# Patient Record
Sex: Female | Born: 1965
Health system: Southern US, Community
[De-identification: ages and names within clinical notes are randomized; demographics above are authoritative.]

## PROBLEM LIST (undated history)

## (undated) DIAGNOSIS — E119 Type 2 diabetes mellitus without complications: Secondary | ICD-10-CM

## (undated) DIAGNOSIS — H269 Unspecified cataract: Secondary | ICD-10-CM

## (undated) DIAGNOSIS — F32A Depression, unspecified: Secondary | ICD-10-CM

## (undated) DIAGNOSIS — R079 Chest pain, unspecified: Secondary | ICD-10-CM

## (undated) DIAGNOSIS — M109 Gout, unspecified: Secondary | ICD-10-CM

## (undated) DIAGNOSIS — M1990 Unspecified osteoarthritis, unspecified site: Secondary | ICD-10-CM

## (undated) DIAGNOSIS — R0602 Shortness of breath: Secondary | ICD-10-CM

## (undated) DIAGNOSIS — T7840XA Allergy, unspecified, initial encounter: Secondary | ICD-10-CM

## (undated) DIAGNOSIS — R42 Dizziness and giddiness: Secondary | ICD-10-CM

## (undated) DIAGNOSIS — F419 Anxiety disorder, unspecified: Secondary | ICD-10-CM

## (undated) DIAGNOSIS — G5600 Carpal tunnel syndrome, unspecified upper limb: Secondary | ICD-10-CM

## (undated) HISTORY — DX: Shortness of breath: R06.02

## (undated) HISTORY — DX: Unspecified cataract: H26.9

## (undated) HISTORY — DX: Gout, unspecified: M10.9

## (undated) HISTORY — DX: Type 2 diabetes mellitus without complications: E11.9

## (undated) HISTORY — DX: Carpal tunnel syndrome, unspecified upper limb: G56.00

## (undated) HISTORY — DX: Depression, unspecified: F32.A

## (undated) HISTORY — PX: EYE SURGERY: SHX253

## (undated) HISTORY — DX: Chest pain, unspecified: R07.9

## (undated) HISTORY — DX: Unspecified osteoarthritis, unspecified site: M19.90

## (undated) HISTORY — PX: TONSILECTOMY, ADENOIDECTOMY, BILATERAL MYRINGOTOMY AND TUBES: SHX2538

## (undated) HISTORY — DX: Allergy, unspecified, initial encounter: T78.40XA

## (undated) HISTORY — DX: Dizziness and giddiness: R42

## (undated) HISTORY — DX: Anxiety disorder, unspecified: F41.9

## (undated) HISTORY — PX: CATARACT EXTRACTION: SUR2

## (undated) HISTORY — PX: OTHER SURGICAL HISTORY: SHX169

---

## 2000-04-15 ENCOUNTER — Emergency Department (HOSPITAL_COMMUNITY): Admission: EM | Admit: 2000-04-15 | Discharge: 2000-04-16 | Payer: Self-pay | Admitting: *Deleted

## 2001-11-27 ENCOUNTER — Emergency Department (HOSPITAL_COMMUNITY): Admission: EM | Admit: 2001-11-27 | Discharge: 2001-11-28 | Payer: Self-pay

## 2002-07-27 ENCOUNTER — Other Ambulatory Visit: Admission: RE | Admit: 2002-07-27 | Discharge: 2002-07-27 | Payer: Self-pay | Admitting: Obstetrics and Gynecology

## 2002-10-25 ENCOUNTER — Encounter: Payer: Self-pay | Admitting: Obstetrics and Gynecology

## 2002-10-25 ENCOUNTER — Ambulatory Visit (HOSPITAL_COMMUNITY): Admission: RE | Admit: 2002-10-25 | Discharge: 2002-10-25 | Payer: Self-pay | Admitting: Obstetrics and Gynecology

## 2003-08-24 ENCOUNTER — Other Ambulatory Visit: Admission: RE | Admit: 2003-08-24 | Discharge: 2003-08-24 | Payer: Self-pay | Admitting: *Deleted

## 2008-07-11 ENCOUNTER — Encounter: Payer: Self-pay | Admitting: Obstetrics and Gynecology

## 2008-07-11 ENCOUNTER — Ambulatory Visit: Payer: Self-pay | Admitting: Obstetrics and Gynecology

## 2008-07-11 ENCOUNTER — Other Ambulatory Visit: Admission: RE | Admit: 2008-07-11 | Discharge: 2008-07-11 | Payer: Self-pay | Admitting: Obstetrics and Gynecology

## 2008-07-28 ENCOUNTER — Ambulatory Visit: Payer: Self-pay | Admitting: Obstetrics and Gynecology

## 2008-08-03 ENCOUNTER — Ambulatory Visit: Payer: Self-pay | Admitting: Obstetrics and Gynecology

## 2008-08-14 ENCOUNTER — Ambulatory Visit: Payer: Self-pay | Admitting: Obstetrics and Gynecology

## 2008-08-28 ENCOUNTER — Ambulatory Visit (HOSPITAL_COMMUNITY): Admission: RE | Admit: 2008-08-28 | Discharge: 2008-08-28 | Payer: Self-pay | Admitting: Obstetrics and Gynecology

## 2008-09-05 ENCOUNTER — Encounter: Admission: RE | Admit: 2008-09-05 | Discharge: 2008-09-05 | Payer: Self-pay | Admitting: Obstetrics and Gynecology

## 2008-09-18 ENCOUNTER — Encounter: Admission: RE | Admit: 2008-09-18 | Discharge: 2008-11-14 | Payer: Self-pay | Admitting: Endocrinology

## 2008-10-09 ENCOUNTER — Emergency Department (HOSPITAL_COMMUNITY): Admission: EM | Admit: 2008-10-09 | Discharge: 2008-10-10 | Payer: Self-pay | Admitting: Emergency Medicine

## 2008-11-29 ENCOUNTER — Encounter: Admission: RE | Admit: 2008-11-29 | Discharge: 2008-11-29 | Payer: Self-pay | Admitting: Family Medicine

## 2009-10-01 ENCOUNTER — Encounter: Payer: Self-pay | Admitting: Internal Medicine

## 2009-10-01 ENCOUNTER — Ambulatory Visit (HOSPITAL_COMMUNITY): Admission: RE | Admit: 2009-10-01 | Discharge: 2009-10-01 | Payer: Self-pay | Admitting: Family Medicine

## 2010-07-10 ENCOUNTER — Encounter (INDEPENDENT_AMBULATORY_CARE_PROVIDER_SITE_OTHER): Payer: Self-pay | Admitting: *Deleted

## 2010-07-10 ENCOUNTER — Ambulatory Visit
Admission: RE | Admit: 2010-07-10 | Discharge: 2010-07-10 | Payer: Self-pay | Source: Home / Self Care | Attending: Internal Medicine | Admitting: Internal Medicine

## 2010-07-10 ENCOUNTER — Other Ambulatory Visit: Payer: Self-pay | Admitting: Internal Medicine

## 2010-07-10 DIAGNOSIS — N951 Menopausal and female climacteric states: Secondary | ICD-10-CM | POA: Insufficient documentation

## 2010-07-10 DIAGNOSIS — R519 Headache, unspecified: Secondary | ICD-10-CM | POA: Insufficient documentation

## 2010-07-10 DIAGNOSIS — F411 Generalized anxiety disorder: Secondary | ICD-10-CM | POA: Insufficient documentation

## 2010-07-10 DIAGNOSIS — H9190 Unspecified hearing loss, unspecified ear: Secondary | ICD-10-CM | POA: Insufficient documentation

## 2010-07-10 DIAGNOSIS — J309 Allergic rhinitis, unspecified: Secondary | ICD-10-CM | POA: Insufficient documentation

## 2010-07-10 DIAGNOSIS — R51 Headache: Secondary | ICD-10-CM

## 2010-07-10 DIAGNOSIS — Z87442 Personal history of urinary calculi: Secondary | ICD-10-CM | POA: Insufficient documentation

## 2010-07-10 DIAGNOSIS — F339 Major depressive disorder, recurrent, unspecified: Secondary | ICD-10-CM | POA: Insufficient documentation

## 2010-07-10 DIAGNOSIS — F329 Major depressive disorder, single episode, unspecified: Secondary | ICD-10-CM | POA: Insufficient documentation

## 2010-07-10 LAB — BASIC METABOLIC PANEL
BUN: 11 mg/dL (ref 6–23)
CO2: 31 mEq/L (ref 19–32)
Calcium: 10.7 mg/dL — ABNORMAL HIGH (ref 8.4–10.5)
Chloride: 110 mEq/L (ref 96–112)
Creatinine, Ser: 0.5 mg/dL (ref 0.4–1.2)
GFR: 131.5 mL/min (ref >60.00)
Glucose, Bld: 99 mg/dL (ref 70–99)
Potassium: 4.5 mEq/L (ref 3.5–5.1)
Sodium: 146 mEq/L — ABNORMAL HIGH (ref 135–145)

## 2010-07-10 LAB — CBC WITH DIFFERENTIAL/PLATELET
Basophils Absolute: 0 10*3/uL (ref 0.0–0.1)
Basophils Relative: 0.9 % (ref 0.0–3.0)
Eosinophils Absolute: 0.1 10*3/uL (ref 0.0–0.7)
Eosinophils Relative: 1.3 % (ref 0.0–5.0)
HCT: 36.8 % (ref 36.0–46.0)
Hemoglobin: 12.7 g/dL (ref 12.0–15.0)
Lymphocytes Relative: 48.2 % — ABNORMAL HIGH (ref 12.0–46.0)
Lymphs Abs: 2.3 10*3/uL (ref 0.7–4.0)
MCHC: 34.4 g/dL (ref 30.0–36.0)
MCV: 92.6 fl (ref 78.0–100.0)
Monocytes Absolute: 0.3 10*3/uL (ref 0.1–1.0)
Monocytes Relative: 7.1 % (ref 3.0–12.0)
Neutro Abs: 2.1 10*3/uL (ref 1.4–7.7)
Neutrophils Relative %: 42.5 % — ABNORMAL LOW (ref 43.0–77.0)
Platelets: 268 10*3/uL (ref 150.0–400.0)
RBC: 3.98 Mil/uL (ref 3.87–5.11)
RDW: 12.6 % (ref 11.5–14.6)
WBC: 4.8 10*3/uL (ref 4.5–10.5)

## 2010-07-10 LAB — HEPATIC FUNCTION PANEL
ALT: 16 U/L (ref 0–35)
AST: 18 U/L (ref 0–37)
Albumin: 4.4 g/dL (ref 3.5–5.2)
Alkaline Phosphatase: 37 U/L — ABNORMAL LOW (ref 39–117)
Bilirubin, Direct: 0.1 mg/dL (ref 0.0–0.3)
Total Bilirubin: 0.7 mg/dL (ref 0.3–1.2)
Total Protein: 7.4 g/dL (ref 6.0–8.3)

## 2010-07-10 LAB — HEMOGLOBIN A1C: Hgb A1c MFr Bld: 6 % (ref 4.6–6.5)

## 2010-07-10 LAB — TSH: TSH: 0.98 u[IU]/mL (ref 0.35–5.50)

## 2010-07-26 ENCOUNTER — Ambulatory Visit
Admission: RE | Admit: 2010-07-26 | Discharge: 2010-07-26 | Payer: Self-pay | Source: Home / Self Care | Attending: Psychology | Admitting: Psychology

## 2010-07-30 ENCOUNTER — Encounter: Payer: Self-pay | Admitting: Internal Medicine

## 2010-08-05 ENCOUNTER — Ambulatory Visit: Admit: 2010-08-05 | Payer: Self-pay | Admitting: Psychology

## 2010-08-05 ENCOUNTER — Encounter: Payer: Self-pay | Admitting: Internal Medicine

## 2010-08-05 LAB — CONVERTED CEMR LAB
AST: 16 units/L
Alkaline Phosphatase: 35 units/L
Calcium: 9.9 mg/dL
Chloride: 111 meq/L
HDL: 74 mg/dL
Hemoglobin: 12 g/dL
LDL Cholesterol: 77 mg/dL
Potassium: 4.6 meq/L
Sodium: 148 meq/L
Triglyceride fasting, serum: 60 mg/dL
WBC: 3.9 10*3/uL

## 2010-08-08 NOTE — Letter (Signed)
Summary: [facility]  [address] [place], [place] [address]   Phone: [phone]  Fax: [phone]    Today's Date: July 10, 2010  Name of Patient: Joy Bautista  The above named patient had a medical visit today 07/10/10 Please take this into consideration when reviewing the time away from work  Special Instructions:  [  ] None     Sincerely yours,   Dr. Rene Paci

## 2010-08-08 NOTE — Letter (Signed)
Summary: [facility]  [facility]   Imported By: Lester [place] 07/22/2010 07:46:01  _____________________________________________________________________  External Attachment:    Type:   Image     Comment:   External Document

## 2010-08-08 NOTE — Assessment & Plan Note (Signed)
Summary: NEW MED COST PT--#--PKG-STC   Vital Signs:  Patient profile:   45 year old female Height:      65 inches (165.10 cm) Weight:      117.8 pounds (53.55 kg) BMI:     19.67 O2 Sat:      99 % on Room air Temp:     98.1 degrees F (36.72 degrees C) oral Pulse rate:   64 / minute BP sitting:   120 / 70  (left arm) Cuff size:   regular  Vitals Entered By: Orlan Leavens RMA (July 10, 2010 8:08 AM)  O2 Flow:  Room air CC: New patient Is Patient Diabetic? No Pain Assessment Patient in pain? no        Primary Care Provider:  Newt Lukes MD  CC:  New patient.  History of Present Illness: new pt to me and our practice, here to est care has followed at family and urg care prev  c/o "anxiety" onset >12mo - "i have always been hyperactive person" but symptoms gradually worse in past 6 mo symptoms worse with stress - family, work denies palpitations, cp/sob or "dread" - but "i am a constant worrier" symptoms better at home denies problems sleeping - no depression symptoms or "sadness" - no SI/HI started on citalopram summer 2011 - min if any improvement in symptoms -  no change in dose since starting also 6 mo ago rx'd alprazolam - causes too much sedation to use during day but uses at bedtime  prev tx with wellbutrin made "anxiety" much worse describes as distractability, impatience, rapid speech and writing, "in a hurry" but very organized  also reviewed chronic med issues - DM2 - diet controlled - never rx'd meds for same - does not regularly follows cbgs and "its normal when i do check" -   2) menopausal syndrome - temp instability, sweating, worse in cool weather - on hrt by urg care but would like gyn eval for same - no bleeding or spotting  3) chronic neck pain - prior MRI at [facility] imaging 2011 "arthiritis and bulge" (report not available at time of OV) - no radiation of apin - symptoms relieved with otc ibuprofen or tylenol - no balance probs, no injury/trauma  and no falls  Preventive Screening-Counseling & Management  Alcohol-Tobacco     Alcohol drinks/day: 0     Alcohol Counseling: not indicated; patient does not drink     Smoking Status: quit     Tobacco Counseling: not to resume use of tobacco products  Caffeine-Diet-Exercise     Caffeine Counseling: not indicated; caffeine use is not excessive or problematic     Does Patient Exercise: yes     Type of exercise: walking     Exercise (avg: min/session): 30-60     Times/week: 7     Exercise Counseling: not indicated; exercise is adequate     Depression Counseling: further diagnostic testing and/or other treatment is indicated  Comments: quit smoking and drinking 1992: reports self as "probable alcoholic before i woke up and realized i was killing myself so i quit"  Safety-Violence-Falls     Seat Belt Counseling: not indicated; patient wears seat belts     Helmet Counseling: not indicated; patient wears helmet when riding bicycle/motocycle     Firearm Counseling: not applicable     Violence Counseling: not applicable     Fall Risk Counseling: not indicated; no significant falls noted  Current Medications (verified): 1)  Ibuprofen 200 Mg  Tabs (Ibuprofen) .... Take Up To 12 A Day As Needed 2)  Acetaminophen 500 Mg Tabs (Acetaminophen) .... Take Up To 10 A Day As Needed 3)  Alprazolam 0.25 Mg Tabs (Alprazolam) .... Take 1 As Needed 4)  Citalopram Hydrobromide 10 Mg Tabs (Citalopram Hydrobromide) .... Take 1 By Mouth Once Daily 5)  Medroxyprogesterone Acetate 5 Mg Tabs (Medroxyprogesterone Acetate) .... Take 1 By Mouth Once Daily 6)  Calcium-Vitamin D 500-200 Mg-Unit Tabs (Calcium Carbonate-Vitamin D) .... Take 1 By Mouth Once Daily 7)  Melatonin 5 Mg Tabs (Melatonin) .... Take 1 At Bedtime As Needed  Allergies (verified): 1)  ! Sulfa 2)  ! * Shellfish  Past History:  Past Medical History: Allergic rhinitis Depression/anxiety (vs adhd) Diabetes mellitus, type II, diet  controlled hx alcohol abuse - quit 1990s neck pain - DDD menopause syndrome age 37y  MD roster: gyn - optho - [facility]  Past Surgical History: Tonsillectomy (1972)  Family History: Family History of Alcoholism/Addiction (parent) Family History of Arthritis (parent, grandparent, other relative) Family History Breast cancer 1st degree relative <50 (grandmother) Family History Diabetes 1st degree (mother, brother, m grandfather) Family History High cholesterol (parents, grandparent) Family History Hypertension (parent)  dad A&W with dyslipidemia -  mom A&W with chol and DM  Social History: Former Smoker- quit 1990s former EtOH abuse - quit and sober since 1990s divorced x 2 (last in 1994) sinlge, lives alone -  works as Health visitor high school educated 2 pregnancy - one abortion and one given for adoption Smoking Status:  quit Does Patient Exercise:  yes  Review of Systems       c/o occ ringing in left ear esp when using phone (on left side); see HPI above. I have reviewed all other systems and they were negative.   Physical Exam  General:  thin, alert, well-developed, well-nourished, and cooperative to examination.    Head:  Normocephalic and atraumatic without obvious abnormalities. No apparent alopecia or balding. Eyes:  vision grossly intact; pupils equal, round and reactive to light.  conjunctiva and lids normal.    Ears:  normal pinnae bilaterally, without erythema, swelling, or tenderness to palpation. TMs clear, without effusion, or cerumen impaction. Hearing grossly normal bilaterally  Mouth:  teeth and gums in good repair; mucous membranes moist, without lesions or ulcers. oropharynx clear without exudate, no erythema.  Neck:  supple, full ROM, no masses, no thyromegaly; no thyroid nodules or tenderness. no JVD or carotid bruits.   Lungs:  normal respiratory effort, no intercostal retractions or use of accessory muscles; normal breath  sounds bilaterally - no crackles and no wheezes.    Heart:  normal rate, regular rhythm, no murmur, and no rub. BLE without edema. Abdomen:  soft, non-tender, normal bowel sounds, no distention; no masses and no appreciable hepatomegaly or splenomegaly.   Genitalia:  defer gyn Msk:  No deformity or scoliosis noted of thoracic or lumbar spine.   Neurologic:  alert & oriented X3 and cranial nerves II-XII symetrically intact.  strength normal in all extremities, sensation intact to light touch, and gait normal. speech fluent without dysarthria or aphasia; follows commands with good comprehension.  Skin:  no rashes, vesicles, ulcers, or erythema. No nodules or irregularity to palpation.  Psych:  pressure speech, Oriented X3, memory intact for recent and remote, normally interactive, good eye contact, not depressed appearing, not agitated, moderately anxious, and easily distracted.     Impression & Recommendations:  Problem # 1:  ANXIETY STATE,  UNSPECIFIED (ICD-300.00) unclear if hx sugests anxiety or adhd or both - prior tx with wellbutrin made symptoms worse and no sig improvement on low dose citalopram thus far inc dose and refer to behav health for adhd battery testing - ok to cont alpraz if needed (<30 use in past 6 mo) consider counseling esp as pt expresses guilt over "prior failed marriages, the abortion and the child given up for adopton" close f/u here 2-4 weeks to review symptoms, meds and test results also labs today r/o other abn  Her updated medication list for this problem includes:    Alprazolam 0.25 Mg Tabs (Alprazolam) .Marland Kitchen... Take 1 by mouth at bedtime as needed and every 8 hours as needed for anxiety symptoms    Citalopram Hydrobromide 20 Mg Tabs (Citalopram hydrobromide) .Marland Kitchen... 1 by mouth once daily  Orders: TLB-CBC Platelet - w/Differential (85025-CBCD) TLB-Hepatic/Liver Function Pnl (80076-HEPATIC) TLB-TSH (Thyroid Stimulating Hormone) (84443-TSH) TLB-BMP (Basic Metabolic  Panel-BMET) (80048-METABOL) Misc. Referral (Misc. Ref) Prescription Created Electronically ([id number])  Problem # 2:  MENOPAUSAL SYNDROME (ICD-627.2)  Orders: Gynecologic Referral (Gyn)  Problem # 3:  HEARING LOSS, LEFT EAR (ICD-389.9)  Orders: Audiology (Audio)  Problem # 4:  DIABETES MELLITUS, TYPE II (ICD-250.00) diet controlled by report, +FH same send for prior records to review - labs today - Orders: TLB-A1C / Hgb A1C (Glycohemoglobin) (83036-A1C)  Complete Medication List: 1)  Ibuprofen 200 Mg Tabs (Ibuprofen) .... Take up to 12 a day as needed 2)  Acetaminophen 500 Mg Tabs (Acetaminophen) .... Take up to 10 a day as needed 3)  Alprazolam 0.25 Mg Tabs (Alprazolam) .... Take 1 by mouth at bedtime as needed and every 8 hours as needed for anxiety symptoms 4)  Citalopram Hydrobromide 20 Mg Tabs (Citalopram hydrobromide) .Marland Kitchen.. 1 by mouth once daily 5)  Medroxyprogesterone Acetate 5 Mg Tabs (Medroxyprogesterone acetate) .... Take 1 by mouth once daily 6)  Calcium-vitamin D 500-200 Mg-unit Tabs (Calcium carbonate-vitamin d) .... Take 1 by mouth once daily 7)  Melatonin 5 Mg Tabs (Melatonin) .... Take 1 at bedtime as needed  Patient Instructions: 1)  it was good to see you today. 2)  test(s) ordered today - your results will be posted on the phone tree for review in 48-72 hours from the time of test completion; call [phone] and enter your 9 digit MRN (listed above on this page, just below your name); if any changes need to be made or there are abnormal results, you will be contacted directly. 3)  we'll make referral to behavior health for neuropsyc testing (?ADHD or other), audiology for hearing testing and to gynecology for menopause symptroms. Our office will contact you regarding these appointment once made. 4)  increase citalopram to 20mg  once daily until testing complete - refill on alprazolam - your prescriptions have been electronically submitted or faxed to your pharmacy.  Please take as directed. Contact our office if you believe you're having problems with the medication(s).  5)  will send for records from [facility] and [facility] to review 6)  Please schedule a follow-up appointment in 2-4 weeks after testing complete to review results and medication plans, call sooner if problems.  Prescriptions: ALPRAZOLAM 0.25 MG TABS (ALPRAZOLAM) take 1 by mouth at bedtime as needed and every 8 hours as needed for anxiety symptoms  #30 x 0   Entered and Authorized by:   Newt Lukes MD   Signed by:   Newt Lukes MD on 07/10/2010   Method used:  Printed then faxed to ...       [facility] [address] (retail)       [phone]       [place], [place]  [address]       Ph: [phone]       Fax: [phone]   RxID:   [id number] CITALOPRAM HYDROBROMIDE 20 MG TABS (CITALOPRAM HYDROBROMIDE) 1 by mouth once daily  #30 x 1   Entered and Authorized by:   Newt Lukes MD   Signed by:   Newt Lukes MD on 07/10/2010   Method used:   Electronically to        [facility] [address] (retail)       [phone]       [place], [place]  [address]       Ph: [phone]       Fax: [phone]   RxID:   [id number]    Orders Added: 1)  TLB-CBC Platelet - w/Differential [85025-CBCD] 2)  TLB-Hepatic/Liver Function Pnl [80076-HEPATIC] 3)  TLB-TSH (Thyroid Stimulating Hormone) [84443-TSH] 4)  TLB-BMP (Basic Metabolic Panel-BMET) [80048-METABOL] 5)  Misc. Referral [Misc. Ref] 6)  Audiology [Audio] 7)  Gynecologic Referral [Gyn] 8)  TLB-A1C / Hgb A1C (Glycohemoglobin) [83036-A1C] 9)  New Patient Level IV [06269] 10)  Prescription Created Electronically [id number]

## 2010-08-14 NOTE — Consult Note (Signed)
Summary: [facility]   Imported By: Sherian Rein 08/09/2010 08:54:26  _____________________________________________________________________  External Attachment:    Type:   Image     Comment:   External Document

## 2010-08-22 ENCOUNTER — Ambulatory Visit: Payer: Self-pay | Admitting: Internal Medicine

## 2010-08-28 ENCOUNTER — Ambulatory Visit: Payer: Self-pay | Admitting: Internal Medicine

## 2010-09-04 ENCOUNTER — Other Ambulatory Visit: Payer: Self-pay | Admitting: Obstetrics and Gynecology

## 2010-09-04 DIAGNOSIS — R928 Other abnormal and inconclusive findings on diagnostic imaging of breast: Secondary | ICD-10-CM

## 2010-09-10 ENCOUNTER — Ambulatory Visit
Admission: RE | Admit: 2010-09-10 | Discharge: 2010-09-10 | Disposition: A | Discharge status: Home / Self Care | Payer: PRIVATE HEALTH INSURANCE | Source: Ambulatory Visit | Attending: Obstetrics and Gynecology | Admitting: Obstetrics and Gynecology

## 2010-09-10 DIAGNOSIS — R928 Other abnormal and inconclusive findings on diagnostic imaging of breast: Secondary | ICD-10-CM

## 2012-04-20 ENCOUNTER — Other Ambulatory Visit: Payer: Self-pay | Admitting: Orthopedic Surgery

## 2012-04-20 ENCOUNTER — Ambulatory Visit
Admission: RE | Admit: 2012-04-20 | Discharge: 2012-04-20 | Disposition: A | Discharge status: Home / Self Care | Payer: BC Managed Care – PPO | Source: Ambulatory Visit | Attending: Orthopedic Surgery | Admitting: Orthopedic Surgery

## 2012-04-20 DIAGNOSIS — M542 Cervicalgia: Secondary | ICD-10-CM

## 2014-01-10 ENCOUNTER — Ambulatory Visit (INDEPENDENT_AMBULATORY_CARE_PROVIDER_SITE_OTHER): Payer: 59 | Admitting: Family Medicine

## 2014-01-10 ENCOUNTER — Encounter: Payer: Self-pay | Admitting: Family Medicine

## 2014-01-10 VITALS — BP 128/68 | HR 79 | Temp 97.9°F | Ht 65.0 in | Wt 120.9 lb

## 2014-01-10 DIAGNOSIS — Z0000 Encounter for general adult medical examination without abnormal findings: Secondary | ICD-10-CM

## 2014-01-10 DIAGNOSIS — Z23 Encounter for immunization: Secondary | ICD-10-CM

## 2014-01-10 DIAGNOSIS — E119 Type 2 diabetes mellitus without complications: Secondary | ICD-10-CM

## 2014-01-10 DIAGNOSIS — F411 Generalized anxiety disorder: Secondary | ICD-10-CM

## 2014-01-10 LAB — LIPID PANEL
CHOLESTEROL: 191 mg/dL (ref 0–200)
HDL: 71 mg/dL (ref >39)
LDL Cholesterol: 101 mg/dL — ABNORMAL HIGH (ref 0–99)
Total CHOL/HDL Ratio: 2.7 Ratio
Triglycerides: 97 mg/dL (ref <150)
VLDL: 19 mg/dL (ref 0–40)

## 2014-01-10 LAB — CBC WITH DIFFERENTIAL/PLATELET
BASOS ABS: 0.1 10*3/uL (ref 0.0–0.1)
BASOS PCT: 1 % (ref 0–1)
EOS ABS: 0.1 10*3/uL (ref 0.0–0.7)
Eosinophils Relative: 1 % (ref 0–5)
HCT: 37.8 % (ref 36.0–46.0)
HEMOGLOBIN: 13.1 g/dL (ref 12.0–15.0)
Lymphocytes Relative: 36 % (ref 12–46)
Lymphs Abs: 1.9 10*3/uL (ref 0.7–4.0)
MCH: 30.5 pg (ref 26.0–34.0)
MCHC: 34.7 g/dL (ref 30.0–36.0)
MCV: 88.1 fL (ref 78.0–100.0)
MONOS PCT: 6 % (ref 3–12)
Monocytes Absolute: 0.3 10*3/uL (ref 0.1–1.0)
NEUTROS PCT: 56 % (ref 43–77)
Neutro Abs: 3 10*3/uL (ref 1.7–7.7)
PLATELETS: 285 10*3/uL (ref 150–400)
RBC: 4.29 MIL/uL (ref 3.87–5.11)
RDW: 13.6 % (ref 11.5–15.5)
WBC: 5.4 10*3/uL (ref 4.0–10.5)

## 2014-01-10 LAB — BASIC METABOLIC PANEL
BUN: 6 mg/dL (ref 6–23)
CALCIUM: 10.3 mg/dL (ref 8.4–10.5)
CO2: 28 mEq/L (ref 19–32)
Chloride: 112 mEq/L (ref 96–112)
Creat: 0.66 mg/dL (ref 0.50–1.10)
GLUCOSE: 110 mg/dL — AB (ref 70–99)
Potassium: 4.7 mEq/L (ref 3.5–5.3)
SODIUM: 148 meq/L — AB (ref 135–145)

## 2014-01-10 LAB — POCT GLYCOSYLATED HEMOGLOBIN (HGB A1C): HEMOGLOBIN A1C: 6.2

## 2014-01-10 LAB — TSH: TSH: 1.101 u[IU]/mL (ref 0.350–4.500)

## 2014-01-10 NOTE — Assessment & Plan Note (Signed)
Although she didn't complain of this during her exam. It is documented in her new patient packet that she was feeling stressed all the time and cannot relax. It seems to be noticeable behind people in her work environment. Patient is to return ASAP for a cervical cancer screening, or have her fill out the PHQ and GAD assessment at that time.

## 2014-01-10 NOTE — Progress Notes (Signed)
   Subjective:    Patient ID: Joy Bautista, female    DOB: 1966/04/28, 48 y.o.   MRN: [id number]  HPI Joy Bautista is a 48 y.o. female presents to family medicine clinic today for establishment of care.  Past medical history: Diabetes: Patient states she has never been on medications for her diabetes. She was told that her sugars were elevated and it could be controlled with dietary changes. She states she started watching the carbohydrates in her diet and walking daily with her dog. Of note she has not followed up with a doctor for 2 years after that diagnosis. Patient denies dizziness, numbness, fatigue, diaphoresis, tingling in her extremities, lightheadedness or syncopal episodes.  Seasonal allergies: Patient states she's occasionally gets seasonal allergies for which she takes over-the-counter medications. She does not have any current complaints. She is uncertain what her triggers are.  Chronic pain, neck fusion C5-C6: Patient had a neck fusion in 2013. She does not take any daily chronic medications. She is not in any pain today. She has not had any complications since her fusion.  Well woman: Patient's last mammogram was in 2012 and was negative. Patient is due for her tetanus shot today. Patient states she has had abnormal Pap smears in the past, but feels she has had at least 3 normal Pap smears since then. Her last documented Pap was in 2010 and was negative. She feels that she's had definitely had a Pap smear since then that might not be in her system. There is a history of breast cancer, although it is with her paternal grandmother. Her last menstrual period was last year after her mammogram which was in June. However before that she hasn't had a period in over 2 years.  Social: Single. Lives alone. Currently not relationship. Prefers female partners. Not sexually active currently. 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 Attended some college. Quit smoking 25 years ago. Denies alcohol  or recreational drug use.  Allergies: Sulfa and shellfish Medications: None Family history documented   Review of Systems   little interest or pleasure in doing things, feeling down and depressed at times. Stress Objective:   Physical Exam BP 128/68  Pulse 79  Temp(Src) 97.9 F (36.6 C) (Oral)  Ht 5\' 5"  (1.651 m)  Wt 120 lb 14.4 oz (54.84 kg)  BMI 20.12 kg/m2 Gen: Very pleasant thin Caucasian female. No acute distress, nontoxic in appearance HEENT: AT. [place].  Bilateral eyes without injections or icterus. MMM. Bilateral nares without erythema or edema. Throat without erythema or exudates.  CV: RRR, no murmurs clicks gallops or rubs Chest: CTAB, no wheeze or crackles Abd: Soft. NTND. BS present. No Masses palpated.  Ext: No erythema. No edema.  Skin: No rashes, purpura or petechiae.  Neuro: Normal gait. PERLA. EOMi. Alert. Cranial nerves II through XII intact. DTRs bilateral normal Psych: Normal affect, demeanor and mood. Normal speech.    Next visit. Pap, depression anxiety screening, colonoscopy screening referral

## 2014-01-10 NOTE — Assessment & Plan Note (Signed)
Tetanus Given today. Mammogram information given for her to schedule as soon as possible. Patient is scheduling cervical cancer screening within the next 2 weeks. Will need colonoscopy screening this year.

## 2014-01-10 NOTE — Assessment & Plan Note (Addendum)
Hemoglobin A1c today is 6.2. Discussed in great detail diabetic diet. In addition she needs to increase her activities. She does walk daily for an hour however it is a slow pace. Advised patient to increase her speed with walking it causes her heart rate to increase, and she breaks a sweat. In addition she is going to watch her diet, AVS on carb medication diet. She is resistant to starting any medication We will retest in 3 months to see how she's doing.

## 2014-01-10 NOTE — Patient Instructions (Addendum)
Basic Carbohydrate Counting for Diabetes Mellitus Carbohydrate counting is a method for keeping track of the amount of carbohydrates you eat. Eating carbohydrates naturally increases the level of sugar (glucose) in your blood, so it is important for you to know the amount that is okay for you to have in every meal. Carbohydrate counting helps keep the level of glucose in your blood within normal limits. The amount of carbohydrates allowed is different for every person. A dietitian can help you calculate the amount that is right for you. Once you know the amount of carbohydrates you can have, you can count the carbohydrates in the foods you want to eat. Carbohydrates are found in the following foods:  Grains, such as breads and cereals.  Dried beans and soy products.  Starchy vegetables, such as potatoes, peas, and corn.  Fruit and fruit juices.  Milk and yogurt.  Sweets and snack foods, such as cake, cookies, candy, chips, soft drinks, and fruit drinks. CARBOHYDRATE COUNTING There are two ways to count the carbohydrates in your food. You can use either of the methods or a combination of both. Reading the "Nutrition Facts" on Packaged Food The "Nutrition Facts" is an area that is included on the labels of almost all packaged food and beverages in the [place]. It includes the serving size of that food or beverage and information about the nutrients in each serving of the food, including the grams (g) of carbohydrate per serving.  Decide the number of servings of this food or beverage that you will be able to eat or drink. Multiply that number of servings by the number of grams of carbohydrate that is listed on the label for that serving. The total will be the amount of carbohydrates you will be having when you eat or drink this food or beverage. Learning Standard Serving Sizes of Food When you eat food that is not packaged or does not include "Nutrition Facts" on the label, you need to  measure the servings in order to count the amount of carbohydrates.A serving of most carbohydrate-rich foods contains about 15 g of carbohydrates. The following list includes serving sizes of carbohydrate-rich foods that provide 15 g ofcarbohydrate per serving:   1 slice of bread (1 oz) or 1 six-inch tortilla.    of a hamburger bun or English muffin.  4-6 crackers.   cup unsweetened dry cereal.    cup hot cereal.   cup rice or pasta.    cup mashed potatoes or  of a large baked potato.  1 cup fresh fruit or one small piece of fruit.    cup canned or frozen fruit or fruit juice.  1 cup milk.   cup plain fat-free yogurt or yogurt sweetened with artificial sweeteners.   cup cooked dried beans or starchy vegetable, such as peas, corn, or potatoes.  Decide the number of standard-size servings that you will eat. Multiply that number of servings by 15 (the grams of carbohydrates in that serving). For example, if you eat 2 cups of strawberries, you will have eaten 2 servings and 30 g of carbohydrates (2 servings x 15 g = 30 g). For foods such as soups and casseroles, in which more than one food is mixed in, you will need to count the carbohydrates in each food that is included. EXAMPLE OF CARBOHYDRATE COUNTING Sample Dinner  3 oz chicken breast.   cup of brown rice.   cup of corn.  1 cup milk.   1 cup strawberries with   sugar-free whipped topping.  Carbohydrate Calculation Step 1: Identify the foods that contain carbohydrates:   Rice.   Corn.   Milk.   Strawberries. Step 2:Calculate the number of servings eaten of each:   2 servings of rice.   1 serving of corn.   1 serving of milk.   1 serving of strawberries. Step 3: Multiply each of those number of servings by 15 g:   2 servings of rice x 15 g = 30 g.   1 serving of corn x 15 g = 15 g.   1 serving of milk x 15 g = 15 g.   1 serving of strawberries x 15 g = 15 g. Step 4: Add  together all of the amounts to find the total grams of carbohydrates eaten: 30 g + 15 g + 15 g + 15 g = 75 g. Document Released: 06/23/2005 Document Revised: 06/28/2013 Document Reviewed: 05/20/2013 [facility] Patient Information 2015 [place]. This information is not intended to replace advice given to you by your health care provider. Make sure you discuss any questions you have with your health care provider.   It was a pleasure meeting you today. Please make sure to follow a diabetic diet and increase exercise 150 minutes a week, enough to make her break a sweat and range her heart rate. Please make a Pap smear appointment on your way out. In addition schedule her mammogram for this year.

## 2014-01-11 ENCOUNTER — Encounter: Payer: Self-pay | Admitting: Family Medicine

## 2014-01-25 ENCOUNTER — Ambulatory Visit (INDEPENDENT_AMBULATORY_CARE_PROVIDER_SITE_OTHER): Payer: 59 | Admitting: Family Medicine

## 2014-01-25 ENCOUNTER — Encounter: Payer: Self-pay | Admitting: Family Medicine

## 2014-01-25 ENCOUNTER — Other Ambulatory Visit (HOSPITAL_COMMUNITY)
Admission: RE | Admit: 2014-01-25 | Discharge: 2014-01-25 | Disposition: A | Discharge status: Home / Self Care | Payer: 59 | Source: Ambulatory Visit | Attending: Family Medicine | Admitting: Family Medicine

## 2014-01-25 VITALS — BP 109/72 | HR 78 | Temp 97.9°F | Ht 65.0 in | Wt 120.0 lb

## 2014-01-25 DIAGNOSIS — Z124 Encounter for screening for malignant neoplasm of cervix: Secondary | ICD-10-CM

## 2014-01-25 DIAGNOSIS — E119 Type 2 diabetes mellitus without complications: Secondary | ICD-10-CM

## 2014-01-25 DIAGNOSIS — Z01419 Encounter for gynecological examination (general) (routine) without abnormal findings: Secondary | ICD-10-CM | POA: Insufficient documentation

## 2014-01-25 DIAGNOSIS — Z0000 Encounter for general adult medical examination without abnormal findings: Secondary | ICD-10-CM

## 2014-01-25 DIAGNOSIS — Z23 Encounter for immunization: Secondary | ICD-10-CM

## 2014-01-25 DIAGNOSIS — Z1151 Encounter for screening for human papillomavirus (HPV): Secondary | ICD-10-CM | POA: Insufficient documentation

## 2014-01-25 DIAGNOSIS — F411 Generalized anxiety disorder: Secondary | ICD-10-CM

## 2014-01-25 HISTORY — DX: Encounter for screening for malignant neoplasm of cervix: Z12.4

## 2014-01-25 MED ORDER — VENLAFAXINE HCL ER 37.5 MG PO CP24: 37.5000 mg | ORAL_CAPSULE | Freq: Every day | ORAL | Status: DC

## 2014-01-25 MED ORDER — VENLAFAXINE HCL ER 75 MG PO CP24: 75.0000 mg | ORAL_CAPSULE | Freq: Every day | ORAL | Status: DC

## 2014-01-25 NOTE — Assessment & Plan Note (Signed)
Pap smear collected today. Will call patient with results once they're available

## 2014-01-25 NOTE — Assessment & Plan Note (Signed)
Up-to-date on tetanus. Patient has mammogram contact information and will make appointment for next month. Pap smear completed today. Patient has information on colonoscopy and will schedule one next month. Pneumovax given today. Foot exam completed today. Eye exam completed today.

## 2014-01-25 NOTE — Progress Notes (Signed)
   Subjective:    Patient ID: Joy Bautista, female    DOB: 09/30/1965, 48 y.o.   MRN: [id number]  HPI Joy Bautista is a 48 y.o. female presents to clinic for cervical screening  Well woman exam: Patient's last mammogram was in 2012 and was negative.  Patient states she has had abnormal Pap smears in the past,. Her last documented Pap was in 2010 and was negative. She feels that she's had definitely had a Pap smear since then that might not be in her system. There is a history of breast cancer, although it is with her paternal grandmother. Her last menstrual period was last year after her mammogram which was in June. However before that she hasn't had a period in over 2 years.  Anxiety/depression: Patient reports it is very difficult to do with her anxiety, and that people at work and her family members have noticed her increased level of anxiety. She feels that she also has mild depression is somewhat Effexor daily living. She feels the she gets so nervous, she then reports getting nervous at the grocery store in the checkout line. She feels like she gets an overwhelming feeling of anxiety that clouds her thoughts. She feels like she is unable to communicate effectively at work and with her family because the flu thoughts in her head prevents her from speaking exactly what she is feeling. She denies SI/HI.   Review of Systems Per history of present illness    Objective:   Physical Exam BP 109/72  Pulse 78  Temp(Src) 97.9 F (36.6 C) (Oral)  Ht 5\' 5"  (1.651 m)  Wt 120 lb (54.432 kg)  BMI 19.97 kg/m2 Gen: NAD. Nontoxic in appearance. Well-developed, well-nourished Caucasian female. CV: RRR, no murmurs clicks gallops or rubs Abd: Soft. Thin. NTND. BS present. No Masses palpated.  Psych: Mildly anxious. Normal affect, dress, demeanor and mood. Normal speech   GYN:  External genitalia within normal limits.  Vaginal mucosa pink, moist, normal rugae.  Nonfriable cervix without lesions, no  discharge or bleeding noted on speculum exam.  Bimanual exam revealed normal, nongravid uterus.  No cervical motion tenderness. No adnexal masses bilaterally.          Assessment & Plan:

## 2014-01-25 NOTE — Patient Instructions (Signed)
Generalized Anxiety Disorder Generalized anxiety disorder (GAD) is a mental disorder. It interferes with life functions, including relationships, work, and school. GAD is different from normal anxiety, which everyone experiences at some point in their lives in response to specific life events and activities. Normal anxiety actually helps us prepare for and get through these life events and activities. Normal anxiety goes away after the event or activity is over.  GAD causes anxiety that is not necessarily related to specific events or activities. It also causes excess anxiety in proportion to specific events or activities. The anxiety associated with GAD is also difficult to control. GAD can vary from mild to severe. People with severe GAD can have intense waves of anxiety with physical symptoms (panic attacks).  SYMPTOMS The anxiety and worry associated with GAD are difficult to control. This anxiety and worry are related to many life events and activities and also occur more days than not for 6 months or longer. People with GAD also have three or more of the following symptoms (one or more in children):  Restlessness.   Fatigue.  Difficulty concentrating.   Irritability.  Muscle tension.  Difficulty sleeping or unsatisfying sleep. DIAGNOSIS GAD is diagnosed through an assessment by your caregiver. Your caregiver will ask you questions aboutyour mood,physical symptoms, and events in your life. Your caregiver may ask you about your medical history and use of alcohol or drugs, including prescription medications. Your caregiver may also do a physical exam and blood tests. Certain medical conditions and the use of certain substances can cause symptoms similar to those associated with GAD. Your caregiver may refer you to a mental health specialist for further evaluation. TREATMENT The following therapies are usually used to treat GAD:   Medication--Antidepressant medication usually is  prescribed for long-term daily control. Antianxiety medications may be added in severe cases, especially when panic attacks occur.   Talk therapy (psychotherapy)--Certain types of talk therapy can be helpful in treating GAD by providing support, education, and guidance. A form of talk therapy called cognitive behavioral therapy can teach you healthy ways to think about and react to daily life events and activities.  Stress managementtechniques--These include yoga, meditation, and exercise and can be very helpful when they are practiced regularly. A mental health specialist can help determine which treatment is best for you. Some people see improvement with one therapy. However, other people require a combination of therapies. Document Released: 10/18/2012 Document Reviewed: 10/18/2012 [facility] Patient Information 2015 [place], [place]. This information is not intended to replace advice given to you by your health care provider. Make sure you discuss any questions you have with your health care provider.  Was a pleasure seeing you today. I will call in your medication for anxiety/depression for you to start tomorrow, I will need to followup with you in 3 weeks.  He received her pneumonia shot today. In addition we will call you with your retinopathy exam results. You'll need to have this yearly a diabetic. Will call you with your Pap smear results. Please schedule your mammogram and colonoscopy as soon as possible.

## 2014-01-25 NOTE — Assessment & Plan Note (Signed)
GAD 19, PHQ 13. Discuss treatments and therapies in length with patient today. We have decided to start Effexor for her depression/anxiety. In addition patient will decide if she would like to followup with Dr. Pascal LuxKane for behavioral therapy. Dr. Carola RhineKane's contact information was given to her today. Followup in 3 weeks

## 2014-01-26 LAB — CYTOLOGY - PAP

## 2014-01-27 ENCOUNTER — Telehealth: Payer: Self-pay | Admitting: Family Medicine

## 2014-01-27 NOTE — Telephone Encounter (Signed)
Please call Joy Bautista and inform her that her PAP was normal. Thanks

## 2014-02-16 ENCOUNTER — Ambulatory Visit (INDEPENDENT_AMBULATORY_CARE_PROVIDER_SITE_OTHER): Payer: 59 | Admitting: Family Medicine

## 2014-02-16 ENCOUNTER — Encounter: Payer: Self-pay | Admitting: Family Medicine

## 2014-02-16 VITALS — BP 103/61 | HR 82 | Temp 98.1°F | Ht 65.0 in | Wt 120.8 lb

## 2014-02-16 DIAGNOSIS — F411 Generalized anxiety disorder: Secondary | ICD-10-CM

## 2014-02-16 NOTE — Patient Instructions (Signed)
Generalized Anxiety Disorder Generalized anxiety disorder (GAD) is a mental disorder. It interferes with life functions, including relationships, work, and school. GAD is different from normal anxiety, which everyone experiences at some point in their lives in response to specific life events and activities. Normal anxiety actually helps us prepare for and get through these life events and activities. Normal anxiety goes away after the event or activity is over.  GAD causes anxiety that is not necessarily related to specific events or activities. It also causes excess anxiety in proportion to specific events or activities. The anxiety associated with GAD is also difficult to control. GAD can vary from mild to severe. People with severe GAD can have intense waves of anxiety with physical symptoms (panic attacks).  SYMPTOMS The anxiety and worry associated with GAD are difficult to control. This anxiety and worry are related to many life events and activities and also occur more days than not for 6 months or longer. People with GAD also have three or more of the following symptoms (one or more in children):  Restlessness.   Fatigue.  Difficulty concentrating.   Irritability.  Muscle tension.  Difficulty sleeping or unsatisfying sleep. DIAGNOSIS GAD is diagnosed through an assessment by your health care provider. Your health care provider will ask you questions aboutyour mood,physical symptoms, and events in your life. Your health care provider may ask you about your medical history and use of alcohol or drugs, including prescription medicines. Your health care provider may also do a physical exam and blood tests. Certain medical conditions and the use of certain substances can cause symptoms similar to those associated with GAD. Your health care provider may refer you to a mental health specialist for further evaluation. TREATMENT The following therapies are usually used to treat GAD:    Medication. Antidepressant medication usually is prescribed for long-term daily control. Antianxiety medicines may be added in severe cases, especially when panic attacks occur.   Talk therapy (psychotherapy). Certain types of talk therapy can be helpful in treating GAD by providing support, education, and guidance. A form of talk therapy called cognitive behavioral therapy can teach you healthy ways to think about and react to daily life events and activities.  Stress managementtechniques. These include yoga, meditation, and exercise and can be very helpful when they are practiced regularly. A mental health specialist can help determine which treatment is best for you. Some people see improvement with one therapy. However, other people require a combination of therapies. Document Released: 10/18/2012 Document Revised: 11/07/2013 Document Reviewed: 10/18/2012 [facility] Patient Information 2015 [place], [place]. This information is not intended to replace advice given to you by your health care provider. Make sure you discuss any questions you have with your health care provider.  I am glad to feeling better. It was nice to see you today. Please make an appointment for 5 months for followup, at that time we will repeat her anxiety assessment. Please call in at the end of September and let me know if you feel like your dose is appropriate or need a higher dose

## 2014-02-16 NOTE — Assessment & Plan Note (Signed)
Patient improved. Continue with 75 mg Effexor. She will call in at the end of September, and to let us know if she fell she needs to be raised to higher dose. Followup in 5 months with repeat GAD and PHQ9

## 2014-02-16 NOTE — Progress Notes (Signed)
   Subjective:    Patient ID: Joy Bautista, female    DOB: Nov 11, 1965, 48 y.o.   MRN: [id number]  HPI Joy Bautista is a 48 y.o. presents for followup to anxiety Anxiety: Patient states she is doing better. She feels the 75 mg Effexor has calmed her down, and she feels she is "not running full steam." She states she still feels like she has word confusion, and thought blocking. She is happy with the current process and is not experiencing any side effects (nausea, headaches, nightmares, elevated BP etc)   Review of Systems Per HPI    Objective:   Physical Exam BP 103/61  Pulse 82  Temp(Src) 98.1 F (36.7 C) (Oral)  Ht 5\' 5"  (1.651 m)  Wt 120 lb 12.8 oz (54.795 kg)  BMI 20.10 kg/m2 Gen: Pleasant, thin Caucasian female. No acute distress, nontoxic in appearance, well-developed, well-nourished HEENT: AT. [place].  Bilateral eyes without injections or icterus. MMM.  CV: RRR, no murmurs appreciated Psych: Normal affect, dress and demeanor. Normal mood, does not appear anxious or depressed. Normal speech.   * GAD and PHQ on return visit.     Assessment & Plan:

## 2014-02-21 ENCOUNTER — Other Ambulatory Visit: Payer: Self-pay | Admitting: Family Medicine

## 2014-02-21 ENCOUNTER — Other Ambulatory Visit: Payer: Self-pay

## 2014-02-21 DIAGNOSIS — Z1231 Encounter for screening mammogram for malignant neoplasm of breast: Secondary | ICD-10-CM

## 2014-03-09 ENCOUNTER — Ambulatory Visit
Admission: RE | Admit: 2014-03-09 | Discharge: 2014-03-09 | Disposition: A | Discharge status: Home / Self Care | Payer: 59 | Source: Ambulatory Visit

## 2014-03-09 ENCOUNTER — Encounter (INDEPENDENT_AMBULATORY_CARE_PROVIDER_SITE_OTHER): Payer: Self-pay

## 2014-03-09 DIAGNOSIS — Z1231 Encounter for screening mammogram for malignant neoplasm of breast: Secondary | ICD-10-CM

## 2014-03-10 ENCOUNTER — Encounter: Payer: Self-pay | Admitting: Family Medicine

## 2014-03-10 ENCOUNTER — Other Ambulatory Visit: Payer: Self-pay | Admitting: Family Medicine

## 2014-03-10 DIAGNOSIS — R928 Other abnormal and inconclusive findings on diagnostic imaging of breast: Secondary | ICD-10-CM

## 2014-03-21 ENCOUNTER — Telehealth: Payer: Self-pay | Admitting: Family Medicine

## 2014-03-21 MED ORDER — VENLAFAXINE HCL ER 75 MG PO CP24: 75.0000 mg | ORAL_CAPSULE | Freq: Every day | ORAL | Status: DC

## 2014-03-21 NOTE — Telephone Encounter (Signed)
Needs venlasaxine increased. Also needs to be refilled. She runs out Saturday

## 2014-03-21 NOTE — Telephone Encounter (Signed)
Prescription refilled as requested. Thanks.

## 2014-03-23 ENCOUNTER — Ambulatory Visit
Admission: RE | Admit: 2014-03-23 | Discharge: 2014-03-23 | Disposition: A | Discharge status: Home / Self Care | Payer: 59 | Source: Ambulatory Visit | Attending: Family Medicine | Admitting: Family Medicine

## 2014-03-23 DIAGNOSIS — R928 Other abnormal and inconclusive findings on diagnostic imaging of breast: Secondary | ICD-10-CM

## 2014-04-24 ENCOUNTER — Telehealth: Payer: Self-pay | Admitting: Family Medicine

## 2014-04-24 ENCOUNTER — Other Ambulatory Visit: Payer: Self-pay | Admitting: Family Medicine

## 2014-04-24 MED ORDER — VENLAFAXINE HCL ER 150 MG PO CP24: 150.0000 mg | ORAL_CAPSULE | Freq: Every day | ORAL | Status: DC

## 2014-04-24 NOTE — Telephone Encounter (Signed)
Called pt regarding received fax with questions on her medication Effexor. She states in the fax she is still depressed most days, but feels her anxiety is better. She feels like she could use a higher dose medication for both her depression and anxiety. She is experiencing some drowsiness, which may be attributed to the medication, that is only occurring at night. She is having "diffuculty getting up and motivated in the morning."She is taking the medication at 8 am in the morning. I have spoken with Toniann FailWendy and gave her options of increasing medication to 150 mg daily, staying were it is at and having her come in for another appointment and discuss other medication alternatives. We discussed the "drowsiness" may or may not be attributed to the medication (~15% known SE), or could be her improvement in her control of anxiety is allowing her sleep better (since it is only happening at night and medication is taken in the morning). We have decided to try the 150 mg daily for 4-6 weeks and follow up with an office visit at that time to discuss any type of changes she may want or evaluate progress.  Joelyn Lover DO

## 2014-04-27 ENCOUNTER — Telehealth: Payer: Self-pay | Admitting: Family Medicine

## 2014-04-27 NOTE — Telephone Encounter (Signed)
Pt called because the increase of her dosage of Effexor 150 mg is just to much for her. She doesn't like how she feels she was hoping the doctor could change it to somewhere in between the original dose and the 150. jw

## 2014-04-28 ENCOUNTER — Telehealth: Payer: Self-pay | Admitting: Family Medicine

## 2014-04-28 NOTE — Telephone Encounter (Signed)
Please call pt, I would encourage her to attempt to stay on medications for another few days to let the drug level out in her system. The side effects she is experiencing will likely decrease once the drug has time to level out. If sh is unable to do so, then we will have to go to the 75 mg dose. There are no dose options between 75 and 150 mg. I will call in 75 mg dose again if she would like... She to let just let me know what she wants to do. Thanks.

## 2014-04-28 NOTE — Telephone Encounter (Signed)
Left message to return call. Please read message to patient from Dr Claiborne BillingsKuneff.Salimatou Simone, Rodena Medinobert Lee

## 2014-05-01 NOTE — Telephone Encounter (Signed)
Spoke with patient and informed her that rx was sent in for 150mg 

## 2014-05-01 NOTE — Telephone Encounter (Signed)
Pt calls, states that she did continue taking the Effexor 150 mg over the weekend and she states that is it now working better and she is able to tolerate higher dosage. Requesting Dr. Claiborne BillingsKuneff to send in new RX for higher dosage. Pls advise.

## 2014-05-01 NOTE — Telephone Encounter (Signed)
The Effexor 150 mg tab medication had already been called in on October 19 and should be available her pharmacy. Thanks

## 2014-05-22 ENCOUNTER — Ambulatory Visit: Payer: 59 | Admitting: Family Medicine

## 2014-05-24 ENCOUNTER — Encounter: Payer: Self-pay | Admitting: Family Medicine

## 2014-05-25 ENCOUNTER — Ambulatory Visit (INDEPENDENT_AMBULATORY_CARE_PROVIDER_SITE_OTHER): Payer: 59 | Admitting: Family Medicine

## 2014-05-25 ENCOUNTER — Encounter: Payer: Self-pay | Admitting: Family Medicine

## 2014-05-25 VITALS — BP 111/73 | HR 91 | Temp 97.9°F | Ht 65.0 in | Wt 120.5 lb

## 2014-05-25 DIAGNOSIS — F411 Generalized anxiety disorder: Secondary | ICD-10-CM

## 2014-05-25 NOTE — Progress Notes (Signed)
   Subjective:    Patient ID: Joy Bautista, female    DOB: 08-16-1965, 48 y.o.   MRN: [id number]  HPI  Generalized anxiety disorder; patient states that she is doing rather well on the increased dose of 150 milligrams of Effexor. She has noticed a difference in her anxiety state, and feels more relaxed. He states people around her have even noticed a difference and that she is able to not be as active, and is able to relax, and talking slower and completing her sentences and communicating more effectively. The only side effect she is having, and she's not certain if it's related to the medication or not, is that she is feeling more fatigued in the morning. She is unable to wake up early and walk her dog like she used to. In addition she has noticed that she sleeps so soundly that she is even went to bed approximately 2-3 times since start of the medication. She takes her medication first thing in the morning, and is not drowsy throughout the day at all. She is happy with the way she feels currently and would like to stay on this dose of medication.  Former smoker quit in 1992  Past Medical History  Diagnosis Date  . Gout   . CTS (carpal tunnel syndrome)   . Anxiety   . Chest pain   . SOB (shortness of breath)   . Dizziness   . Diabetes mellitus without complication   . Cataract     bilateral   Allergies  Allergen Reactions  . Shellfish Allergy   . Sulfonamide Derivatives      Review of Systems Per history of present illness     Objective:   Physical Exam BP 111/73 mmHg  Pulse 91  Temp(Src) 97.9 F (36.6 C) (Oral)  Ht 5\' 5"  (1.651 m)  Wt 120 lb 8 oz (54.658 kg)  BMI 20.05 kg/m2 Gen: Very pleasant, Caucasian female, no acute distress, nontoxic in appearance, well-developed, well-nourished. HEENT: AT. [place]. Bilateral eyes without injections or icterus. MMM. CV: RRR  Psych: Pleasant, appears more relaxed, normal affect, dress, demeanor and mood. Normal speech.    Assessment  & Plan:

## 2014-05-25 NOTE — Patient Instructions (Signed)
I'm glad you are feeling so well, I will need to see you in May to follow-up on your anxiety.

## 2014-05-25 NOTE — Assessment & Plan Note (Signed)
Patient responding well to 150 mg Effexor. We'll continue this dose and follow-up in approximately 6 months.

## 2014-06-02 ENCOUNTER — Other Ambulatory Visit: Payer: Self-pay | Admitting: Family Medicine

## 2014-06-03 ENCOUNTER — Other Ambulatory Visit: Payer: Self-pay | Admitting: Family Medicine

## 2014-09-03 ENCOUNTER — Other Ambulatory Visit: Payer: Self-pay | Admitting: Family Medicine

## 2014-09-26 ENCOUNTER — Ambulatory Visit (INDEPENDENT_AMBULATORY_CARE_PROVIDER_SITE_OTHER): Payer: 59 | Admitting: Family Medicine

## 2014-09-26 ENCOUNTER — Encounter: Payer: Self-pay | Admitting: Family Medicine

## 2014-09-26 VITALS — BP 111/67 | HR 83 | Temp 98.0°F | Ht 65.0 in | Wt 117.4 lb

## 2014-09-26 DIAGNOSIS — F411 Generalized anxiety disorder: Secondary | ICD-10-CM | POA: Diagnosis not present

## 2014-09-26 DIAGNOSIS — E119 Type 2 diabetes mellitus without complications: Secondary | ICD-10-CM

## 2014-09-26 LAB — COMPREHENSIVE METABOLIC PANEL
ALBUMIN: 4.5 g/dL (ref 3.5–5.2)
ALT: 13 U/L (ref 0–35)
AST: 16 U/L (ref 0–37)
Alkaline Phosphatase: 51 U/L (ref 39–117)
BILIRUBIN TOTAL: 0.4 mg/dL (ref 0.2–1.2)
BUN: 13 mg/dL (ref 6–23)
CHLORIDE: 113 meq/L — AB (ref 96–112)
CO2: 32 mEq/L (ref 19–32)
Calcium: 9.5 mg/dL (ref 8.4–10.5)
Creat: 0.67 mg/dL (ref 0.50–1.10)
Glucose, Bld: 106 mg/dL — ABNORMAL HIGH (ref 70–99)
POTASSIUM: 4.3 meq/L (ref 3.5–5.3)
Sodium: 149 mEq/L — ABNORMAL HIGH (ref 135–145)
Total Protein: 6.3 g/dL (ref 6.0–8.3)

## 2014-09-26 LAB — POCT GLYCOSYLATED HEMOGLOBIN (HGB A1C): HEMOGLOBIN A1C: 5.8

## 2014-09-26 MED ORDER — VENLAFAXINE HCL ER 150 MG PO CP24
ORAL_CAPSULE | ORAL | Status: DC
Start: 2014-09-26 — End: 2015-10-01

## 2014-09-26 NOTE — Progress Notes (Signed)
   Subjective:    Patient ID: Joy Bautista, female    DOB: February 18, 1966, 49 y.o.   MRN: [id number]  HPI  anxiety: She presents today for depression with anxiety follow-up. She states she is doing "great" since the start of 150 mg of Effexor daily. 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 He is living her new job and feels more physically active and happy. She denies any depression symptoms at this time.  Elevated A1c: Patient had a history of elevated A1c to 6.2 approximately 6 months ago. She has been watching her diet more closely, has not been exercising as much as she would like. She was resistant to start medications then and wanted to try dietary and behavioral changes.  Insurance forms: Patient brings in her new insurance form from her new employer, that request that a physical along with CBC, CMP, TSH and lipid profile be completed. These have been completed within the past 6 months, paperwork was filled out today.  Former smoker  Past Medical History  Diagnosis Date  . Gout   . CTS (carpal tunnel syndrome)   . Anxiety   . Chest pain   . SOB (shortness of breath)   . Dizziness   . Diabetes mellitus without complication   . Cataract     bilateral   Allergies  Allergen Reactions  . Shellfish Allergy   . Sulfonamide Derivatives    Review of Systems Per HPI    Objective:   Physical Exam BP 111/67 mmHg  Pulse 83  Temp(Src) 98 F (36.7 C) (Oral)  Ht 5\' 5"  (1.651 m)  Wt 117 lb 6.4 oz (53.252 kg)  BMI 19.54 kg/m2 Gen: NAD. Toxic in appearance, very pleasant Caucasian female well-developed well-nourished, thin HEENT: AT. [place]. Bilateral TM visualized and normal in appearance. Bilateral eyes without injections or icterus. MMM. Bilateral nares without erythema or swelling Throat without erythema or exudates.  CV: RRR no murmur Chest: CTAB, no wheeze or crackles Abd: Soft. Flat. NTND. BS present. No Masses palpated.  Ext:  No erythema. No edema. Plus 2/4 PT Skin: No rashes, purpura or petechiae.  Neuro:  Normal gait. PERLA. EOMi. Alert.  Psych: Normal affect dress and demeanor. Normal speech, normal mood. Appears happy.     Assessment & Plan:

## 2014-09-26 NOTE — Assessment & Plan Note (Signed)
Repeat A1c today is 5.8, continue diet and exercise. Will refrain from addition of metformin. Follow-up in 6 months.

## 2014-09-26 NOTE — Assessment & Plan Note (Signed)
Patient doing rather well and 150 mg Effexor daily. Continue current regimen. Follow-up in 6 months

## 2014-09-26 NOTE — Patient Instructions (Signed)
It was great to see you today. I am so happy you are doing well. We will get a CMP lab work for [employer] insurance company. Keep up the good work with getting the A1c down your 5.8 today, continue to watch her diet and exercise. You will need to follow-up in about 6 months

## 2014-09-27 ENCOUNTER — Telehealth: Payer: Self-pay | Admitting: Family Medicine

## 2014-09-27 NOTE — Telephone Encounter (Signed)
Letter mailed to patient. Clarkson Rosselli,CMA  

## 2014-09-27 NOTE — Telephone Encounter (Signed)
Please call patient and inform her that her blood work we did yesterday has no concerns, and has been consistent with s blood work in the past. Thank you

## 2015-08-02 ENCOUNTER — Emergency Department (HOSPITAL_COMMUNITY)
Admission: EM | Admit: 2015-08-02 | Discharge: 2015-08-02 | Disposition: A | Discharge status: Home / Self Care | Payer: BLUE CROSS/BLUE SHIELD | Source: Home / Self Care | Attending: Family Medicine | Admitting: Family Medicine

## 2015-08-02 ENCOUNTER — Encounter (HOSPITAL_COMMUNITY): Payer: Self-pay | Admitting: Emergency Medicine

## 2015-08-02 DIAGNOSIS — B349 Viral infection, unspecified: Secondary | ICD-10-CM

## 2015-08-02 LAB — POCT URINALYSIS DIP (DEVICE)
BILIRUBIN URINE: NEGATIVE
Bilirubin Urine: NEGATIVE
Bilirubin Urine: NEGATIVE
GLUCOSE, UA: NEGATIVE mg/dL
Glucose, UA: NEGATIVE mg/dL
Glucose, UA: NEGATIVE mg/dL
KETONES UR: NEGATIVE mg/dL
Ketones, ur: NEGATIVE mg/dL
Ketones, ur: NEGATIVE mg/dL
LEUKOCYTES UA: NEGATIVE
Leukocytes, UA: NEGATIVE
Leukocytes, UA: NEGATIVE
NITRITE: NEGATIVE
NITRITE: NEGATIVE
Nitrite: NEGATIVE
PH: 5.5 (ref 5.0–8.0)
PH: 5.5 (ref 5.0–8.0)
PH: 6 (ref 5.0–8.0)
PROTEIN: 100 mg/dL — AB
PROTEIN: 30 mg/dL — AB
PROTEIN: 30 mg/dL — AB
SPECIFIC GRAVITY, URINE: 1.025 (ref 1.005–1.030)
Specific Gravity, Urine: 1.025 (ref 1.005–1.030)
Specific Gravity, Urine: 1.025 (ref 1.005–1.030)
UROBILINOGEN UA: 0.2 mg/dL (ref 0.0–1.0)
UROBILINOGEN UA: 0.2 mg/dL (ref 0.0–1.0)
UROBILINOGEN UA: 0.2 mg/dL (ref 0.0–1.0)

## 2015-08-02 MED ORDER — IPRATROPIUM BROMIDE 0.06 % NA SOLN: 2.0000 | Freq: Four times a day (QID) | NASAL | Status: DC

## 2015-08-02 NOTE — ED Provider Notes (Signed)
CSN: [id number]     Arrival date & time 08/02/15  1300 History   First MD Initiated Contact with Patient 08/02/15 1317     Chief Complaint  Patient presents with  . URI   (Consider location/radiation/quality/duration/timing/severity/associated sxs/prior Treatment) HPI Comments: 50 year old female states that 3 days ago she started out with a dry cough. This was followed by a low-grade temperature of 99.2 at home. In the last couple days she has had headache, sore throat, earaches and body aches. She states that her skin hurts and muscles ache. Yesterday she had an episode of nausea and vomiting once as well as an episode of diarrhea. No more of these symptoms followed. In none today. Her only medication is Robitussin DM.   Past Medical History  Diagnosis Date  . Gout   . CTS (carpal tunnel syndrome)   . Anxiety   . Chest pain   . SOB (shortness of breath)   . Dizziness   . Diabetes mellitus without complication (HCC)   . Cataract     bilateral   Past Surgical History  Procedure Laterality Date  . Neck fusion c5-6    . Tonsilectomy, adenoidectomy, bilateral myringotomy and tubes    . Cataract extraction     No family history on file. Social History  Substance Use Topics  . Smoking status: Former Smoker    Types: Cigarettes    Quit date: 10/30/1990  . Smokeless tobacco: None  . Alcohol Use: No   OB History    No data available     Review of Systems  Constitutional: Positive for fever, activity change and appetite change. Negative for chills and fatigue.  HENT: Positive for congestion, rhinorrhea and sore throat. Negative for facial swelling and postnasal drip.   Eyes: Negative.   Respiratory: Positive for cough. Negative for shortness of breath.   Cardiovascular: Negative.   Gastrointestinal: Negative for abdominal pain.       As per history of present illness  Genitourinary: Negative for dysuria, urgency, frequency, flank pain and pelvic pain.       Malodorous urine   Musculoskeletal: Positive for myalgias. Negative for neck pain and neck stiffness.  Skin: Negative for pallor and rash.  Neurological: Negative.     Allergies  Shellfish allergy and Sulfonamide derivatives  Home Medications   Prior to Admission medications   Medication Sig Start Date End Date Taking? Authorizing Provider  OVER THE COUNTER MEDICATION Cough syrup and decongestant.   Yes Historical Provider, MD  acetaminophen (TYLENOL) 325 MG tablet Take 650 mg by mouth every 6 (six) hours as needed.    Historical Provider, MD  ipratropium (ATROVENT) 0.06 % nasal spray Place 2 sprays into both nostrils 4 (four) times daily. 08/02/15   Hayden Rasmussen, NP  venlafaxine XR (EFFEXOR-XR) 150 MG 24 hr capsule TAKE ONE CAPSULE BY MOUTH DAILY WITH BREAKFAST 09/26/14   Renee A Kuneff, DO   Meds Ordered and Administered this Visit  Medications - No data to display  BP 126/85 mmHg  Pulse 116  Temp(Src) 98.2 F (36.8 C) (Oral)  Resp 18  SpO2 98% No data found.   Physical Exam  Constitutional: She is oriented to person, place, and time. She appears well-developed and well-nourished. No distress.  HENT:  Bilateral TMs are normal Oropharynx with minimal erythema. No cobblestoning or current drainage. No exudates or swelling.  Eyes: Conjunctivae and EOM are normal.  Neck: Normal range of motion. Neck supple.  Cardiovascular: Normal rate, regular rhythm, normal heart  sounds and intact distal pulses.   Pulmonary/Chest: Effort normal and breath sounds normal. No respiratory distress. She has no wheezes. She has no rales.  Abdominal: Soft. There is no tenderness.  Musculoskeletal: Normal range of motion. She exhibits no edema.  Lymphadenopathy:    She has no cervical adenopathy.  Neurological: She is alert and oriented to person, place, and time. She exhibits normal muscle tone.  Skin: Skin is warm and dry. No rash noted. No erythema.  Nursing note and vitals reviewed.   ED Course  Procedures  (including critical care time)  Labs Review Labs Reviewed  POCT URINALYSIS DIP (DEVICE) - Abnormal; Notable for the following:    Hgb urine dipstick SMALL (*)    Protein, ur 100 (*)    All other components within normal limits  POCT URINALYSIS DIP (DEVICE) - Abnormal; Notable for the following:    Hgb urine dipstick SMALL (*)    Protein, ur 30 (*)    All other components within normal limits    Imaging Review No results found.   Visual Acuity Review  Right Eye Distance:   Left Eye Distance:   Bilateral Distance:    Right Eye Near:   Left Eye Near:    Bilateral Near:         MDM   1. Viral illness    Atrovent nasal spray as needed for drainage Recommend taking Allegra or Zyrtec as needed for nasal drainage and congestion Ibuprofen every 6 hours as needed for discomfort Drink plenty of fluids and stay well-hydrated Saline nasal spray frequently.     Hayden Rasmussen, NP 08/02/15 [phone]

## 2015-08-02 NOTE — ED Notes (Signed)
Clarified poct urine result with david mabe, np and stasha moore, rad/phleb.

## 2015-08-02 NOTE — Discharge Instructions (Signed)
Viral Infections Atrovent nasal spray as needed for drainage Recommend taking Allegra or Zyrtec as needed for nasal drainage and congestion Ibuprofen every 6 hours as needed for discomfort Drink plenty of fluids and stay well-hydrated Saline nasal spray frequently. A viral infection can be caused by different types of viruses.Most viral infections are not serious and resolve on their own. However, some infections may cause severe symptoms and may lead to further complications. SYMPTOMS Viruses can frequently cause:  Minor sore throat.  Aches and pains.  Headaches.  Runny nose.  Different types of rashes.  Watery eyes.  Tiredness.  Cough.  Loss of appetite.  Gastrointestinal infections, resulting in nausea, vomiting, and diarrhea. These symptoms do not respond to antibiotics because the infection is not caused by bacteria. However, you might catch a bacterial infection following the viral infection. This is sometimes called a "superinfection." Symptoms of such a bacterial infection may include:  Worsening sore throat with pus and difficulty swallowing.  Swollen neck glands.  Chills and a high or persistent fever.  Severe headache.  Tenderness over the sinuses.  Persistent overall ill feeling (malaise), muscle aches, and tiredness (fatigue).  Persistent cough.  Yellow, green, or brown mucus production with coughing. HOME CARE INSTRUCTIONS   Only take over-the-counter or prescription medicines for pain, discomfort, diarrhea, or fever as directed by your caregiver.  Drink enough water and fluids to keep your urine clear or pale yellow. Sports drinks can provide valuable electrolytes, sugars, and hydration.  Get plenty of rest and maintain proper nutrition. Soups and broths with crackers or rice are fine. SEEK IMMEDIATE MEDICAL CARE IF:   You have severe headaches, shortness of breath, chest pain, neck pain, or an unusual rash.  You have uncontrolled vomiting,  diarrhea, or you are unable to keep down fluids.  You or your child has an oral temperature above 102 F (38.9 C), not controlled by medicine.  Your baby is older than 3 months with a rectal temperature of 102 F (38.9 C) or higher.  Your baby is 66 months old or younger with a rectal temperature of 100.4 F (38 C) or higher. MAKE SURE YOU:   Understand these instructions.  Will watch your condition.  Will get help right away if you are not doing well or get worse.   This information is not intended to replace advice given to you by your health care provider. Make sure you discuss any questions you have with your health care provider.   Document Released: 04/02/2005 Document Revised: 09/15/2011 Document Reviewed: 11/29/2014 Elsevier Interactive Patient Education [employer]

## 2015-08-02 NOTE — ED Notes (Signed)
Headache, sore throat, and bilateral ear ache, body aches, even skin hurts.  Reports a dry cough, but non-productive cough.  One episode of vomiting and diarrhea yesterday and overall poor appetite

## 2015-08-09 ENCOUNTER — Encounter: Payer: Self-pay | Admitting: Internal Medicine

## 2015-08-09 ENCOUNTER — Ambulatory Visit (INDEPENDENT_AMBULATORY_CARE_PROVIDER_SITE_OTHER): Payer: BLUE CROSS/BLUE SHIELD | Admitting: Internal Medicine

## 2015-08-09 VITALS — BP 116/82 | HR 88 | Temp 98.5°F | Ht 65.0 in | Wt 118.0 lb

## 2015-08-09 DIAGNOSIS — Z0000 Encounter for general adult medical examination without abnormal findings: Secondary | ICD-10-CM

## 2015-08-09 DIAGNOSIS — L989 Disorder of the skin and subcutaneous tissue, unspecified: Secondary | ICD-10-CM

## 2015-08-09 LAB — LIPID PANEL
CHOLESTEROL: 170 mg/dL (ref 125–200)
HDL: 57 mg/dL (ref >=46)
LDL Cholesterol: 95 mg/dL (ref <130)
Total CHOL/HDL Ratio: 3 Ratio (ref <=5.0)
Triglycerides: 89 mg/dL (ref <150)
VLDL: 18 mg/dL (ref <30)

## 2015-08-09 LAB — BASIC METABOLIC PANEL
BUN: 9 mg/dL (ref 7–25)
CALCIUM: 10 mg/dL (ref 8.6–10.4)
CO2: 30 mmol/L (ref 20–31)
CREATININE: 0.7 mg/dL (ref 0.50–1.05)
Chloride: 111 mmol/L — ABNORMAL HIGH (ref 98–110)
GLUCOSE: 117 mg/dL — AB (ref 65–99)
Potassium: 4.4 mmol/L (ref 3.5–5.3)
Sodium: 149 mmol/L — ABNORMAL HIGH (ref 135–146)

## 2015-08-09 LAB — CBC
HCT: 37.3 % (ref 36.0–46.0)
HEMOGLOBIN: 12.4 g/dL (ref 12.0–15.0)
MCH: 30.2 pg (ref 26.0–34.0)
MCHC: 33.2 g/dL (ref 30.0–36.0)
MCV: 91 fL (ref 78.0–100.0)
MPV: 9.4 fL (ref 8.6–12.4)
Platelets: 321 10*3/uL (ref 150–400)
RBC: 4.1 MIL/uL (ref 3.87–5.11)
RDW: 13 % (ref 11.5–15.5)
WBC: 5.8 10*3/uL (ref 4.0–10.5)

## 2015-08-09 NOTE — Progress Notes (Signed)
Subjective:    Joy Bautista - 50 y.o. female MRN [id number]  Date of birth: [birth year]  HPI  Joy Bautista is a 50 year old female who presents to the clinic for annual physical exam.   Well Women Exam: Patient's last mammogram was in 2015 and was negative. No breast concerns. Patient's last PAP was in 2015 and was negative for malignancy and high risk HPV. Patient reports that her she is menopausal and denies vaginal bleeding. Denies vaginal discharge and declines STD testing.   Skin: Patient reports that she has a new mole on her thigh that she is concerned about.      -  reports that she quit smoking about 24 years ago. Her smoking use included Cigarettes. She does not have any smokeless tobacco history on file. - Review of Systems: Per HPI. - Past Medical History: Patient Active Problem List   Diagnosis Date Noted  . Skin abnormality 08/09/2015  . Cervical cancer screening 01/25/2014  . Health care maintenance 01/10/2014  . DM type 2 (diabetes mellitus, type 2) (HCC) 07/10/2010  . Generalized anxiety disorder 07/10/2010  . DEPRESSION 07/10/2010  . HEARING LOSS, LEFT EAR 07/10/2010  . ALLERGIC RHINITIS 07/10/2010  . MENOPAUSAL SYNDROME 07/10/2010  . HEADACHE 07/10/2010  . RENAL CALCULUS, HX OF 07/10/2010   - Medications: reviewed and updated Current Outpatient Prescriptions  Medication Sig Dispense Refill  . acetaminophen (TYLENOL) 325 MG tablet Take 650 mg by mouth every 6 (six) hours as needed.    Marland Kitchen ipratropium (ATROVENT) 0.06 % nasal spray Place 2 sprays into both nostrils 4 (four) times daily. 15 mL 12  . OVER THE COUNTER MEDICATION Cough syrup and decongestant.    . venlafaxine XR (EFFEXOR-XR) 150 MG 24 hr capsule TAKE ONE CAPSULE BY MOUTH DAILY WITH BREAKFAST 90 capsule 2   No current facility-administered medications for this visit.       Objective:   Physical Exam BP 116/82 mmHg  Pulse 88  Temp(Src) 98.5 F (36.9 C) (Oral)  Ht  (1.651 m)  Wt  118 lb (53.524 kg)  BMI 19.64 kg/m2  SpO2 99% Gen: NAD, alert, cooperative with exam, well-appearing CV: RRR, good S1/S2, no murmur, no edema, capillary refill brisk  Resp: CTABL, no wheezes, non-labored Abd: SNTND, BS present, no guarding or organomegaly Skin: small, approximately 2mm mole present on R thigh. Mole is uniformly dark brown with well defined borders. Multiple skin tags present on upper extremities including L upper arm and in the R axilla.  Neuro: no gross deficits.  Psych: good insight, alert and oriented        Assessment & Plan:   Health care maintenance Patient with history of possible mass in left breast. Diagnostic mammogram performed in 2015 and mass found to be fibroglandular tissue. Recommended bilateral screening mammogram for f/u. Discussed with patient that her last PAP in 2015 was negative for malignancy and high risk HPV. Patient declined pelvic exam. -referral given for screening mammogram as this was the recommendation from previous diagnostic mammogram and patient without new breast complaints/concerns  -patient agreeable to follow the guidelines for cervical cancer screening and will have next PAP in 2017  -CBC, BEMT, and lipid panel obtained today for insurance screening purposes  -would calculate ACS risk with lipid panel results    Skin abnormality Mole on thigh appears benign.  -counseled patient on ABCs of skin moles and reasons to have another skin check  -patient to schedule appointment with derm clinic if she  wishes to have skin tags removed      Marcy Siren, D.O. 08/09/2015, 9:46 AM PGY-1, [facility]

## 2015-08-09 NOTE — Assessment & Plan Note (Addendum)
Patient with history of possible mass in left breast. Diagnostic mammogram performed in 2015 and mass found to be fibroglandular tissue. Recommended bilateral screening mammogram for f/u. Discussed with patient that her last PAP in 2015 was negative for malignancy and high risk HPV. Patient declined pelvic exam. -referral given for screening mammogram as this was the recommendation from previous diagnostic mammogram and patient without new breast complaints/concerns  -patient agreeable to follow the guidelines for cervical cancer screening and will have next PAP in 2017  -CBC, BEMT, and lipid panel obtained today for insurance screening purposes  -would calculate ACS risk with lipid panel results

## 2015-08-09 NOTE — Patient Instructions (Signed)
Thank you for coming to see me today. It was a pleasure. Today we talked about:   Mammogram: Please get one at your earliest convenience.   Skin: Watch your moles for changes in size, color, and irregular shapes/borders. You can schedule an appointment with the dermatology clinic or Dr. Caleb Popp to have the skin tags removed if they are bothersome.   Please follow-up with Dr. Marylu Lund in 1 year or earlier if needed.   If you have any questions or concerns, please do not hesitate to call the office at [phone].  Take Care,   Marcy Siren, DO

## 2015-08-09 NOTE — Assessment & Plan Note (Signed)
Mole on thigh appears benign.  -counseled patient on ABCs of skin moles and reasons to have another skin check  -patient to schedule appointment with derm clinic if she wishes to have skin tags removed

## 2015-10-01 ENCOUNTER — Other Ambulatory Visit: Payer: Self-pay | Admitting: *Deleted

## 2015-10-01 MED ORDER — VENLAFAXINE HCL ER 150 MG PO CP24: ORAL_CAPSULE | ORAL | Status: DC

## 2015-10-01 NOTE — Telephone Encounter (Signed)
LVM for pt to call back to inform her of below and to see sbout scheduling her an appointment. Lamonte SakaiZimmerman Rumple, April D, [place]

## 2015-10-01 NOTE — Telephone Encounter (Signed)
Will approve one refill. Patient requires a follow-up visit for future refills. 

## 2015-12-25 ENCOUNTER — Other Ambulatory Visit: Payer: Self-pay | Admitting: Family Medicine

## 2015-12-26 NOTE — Telephone Encounter (Signed)
Pt has made an appt w/ dr Myrtie Somanwarden for July 5.  She doesn't have enough pills to last until them.  Can she get enough to last until then?

## 2015-12-26 NOTE — Telephone Encounter (Signed)
Patient needs an appointment for refills

## 2015-12-27 MED ORDER — VENLAFAXINE HCL ER 150 MG PO CP24: ORAL_CAPSULE | ORAL | Status: DC

## 2015-12-27 NOTE — Telephone Encounter (Signed)
30 day supply given. Please inform patient.

## 2016-01-09 ENCOUNTER — Encounter: Payer: Self-pay | Admitting: Family Medicine

## 2016-01-09 ENCOUNTER — Ambulatory Visit (INDEPENDENT_AMBULATORY_CARE_PROVIDER_SITE_OTHER): Payer: 59 | Admitting: Family Medicine

## 2016-01-09 VITALS — BP 117/62 | HR 85 | Temp 98.1°F | Wt 126.0 lb

## 2016-01-09 DIAGNOSIS — F4323 Adjustment disorder with mixed anxiety and depressed mood: Secondary | ICD-10-CM | POA: Diagnosis not present

## 2016-01-09 DIAGNOSIS — F411 Generalized anxiety disorder: Secondary | ICD-10-CM

## 2016-01-09 DIAGNOSIS — F329 Major depressive disorder, single episode, unspecified: Secondary | ICD-10-CM | POA: Diagnosis not present

## 2016-01-09 DIAGNOSIS — F32A Depression, unspecified: Secondary | ICD-10-CM

## 2016-01-09 MED ORDER — VENLAFAXINE HCL ER 150 MG PO CP24: ORAL_CAPSULE | ORAL | Status: DC

## 2016-01-09 NOTE — Patient Instructions (Signed)
It was a pleasure seeing you today.  I have written you a prescription for your medication today.  I would like to see you again in February for your annual physical to address any medical concerns you might have and to bring you up to date on some of your health maintenance.   Sincerely, Elwanda Brooklynaniel Halston Fairclough, M.D.

## 2016-01-09 NOTE — Progress Notes (Deleted)
   Subjective:    Patient ID: Joy Bautista, female    DOB: Aug 23, 1965, 50 y.o.   MRN: [id number]  HPI    Review of Systems     Objective:   Physical Exam        Assessment & Plan:

## 2016-01-10 NOTE — Assessment & Plan Note (Signed)
Stable, patient's GAD7 score was 1 and she feels very satisfied with everything now.   -continue venlafaxine f/u in February.

## 2016-01-10 NOTE — Assessment & Plan Note (Signed)
Depression is stable, patient says that she is very satisfied with everything at this point in her life.   -continue venlafaxine, wrote for 8 refills, will follow up in February.

## 2016-01-10 NOTE — Progress Notes (Signed)
    Subjective:  Joy Bautista is a 50 y.o. female who [facility] for a refill of her venlafaxine.   HPI:  Joy Bautista has no complaints this afternoon and is doing well on her medication. She is taking venlafaxine for her depression and anxiety and has scored a 1 on the GAD7.  She has been taking it for nearly a year now and wishes to continue.  She denies any NVD, CP, SOB, changes with bowel or bladder habits or any changes in her mood of cognition.      Objective:  Physical Exam: BP 117/62 mmHg  Pulse 85  Temp(Src) 98.1 F (36.7 C) (Oral)  Wt 126 lb (57.153 kg)  Gen: well appearing, NAD, resting comfortably CV: RRR with no murmurs appreciated Pulm: NWOB, CTAB with no crackles, wheezes, or rhonchi GI: Normal bowel sounds present. Soft, Nontender, Nondistended. MSK: no edema, cyanosis, or clubbing noted Skin: warm, dry Neuro: grossly normal, moves all extremities Psych: Normal affect and thought content  No results found for this or any previous visit (from the past 72 hour(s)).   Assessment/Plan:  DEPRESSION Depression is stable, patient says that she is very satisfied with everything at this point in her life.   -continue venlafaxine, wrote for 8 refills, will follow up in February.   Generalized anxiety disorder Stable, patient's GAD7 score was 1 and she feels very satisfied with everything now.   -continue venlafaxine f/u in February.

## 2016-03-22 ENCOUNTER — Encounter (HOSPITAL_COMMUNITY): Payer: Self-pay | Admitting: *Deleted

## 2016-03-22 ENCOUNTER — Ambulatory Visit (HOSPITAL_COMMUNITY)
Admission: EM | Admit: 2016-03-22 | Discharge: 2016-03-22 | Disposition: A | Discharge status: Home / Self Care | Payer: 59 | Attending: Family Medicine | Admitting: Family Medicine

## 2016-03-22 DIAGNOSIS — S61219A Laceration without foreign body of unspecified finger without damage to nail, initial encounter: Secondary | ICD-10-CM | POA: Diagnosis not present

## 2016-03-22 NOTE — ED Provider Notes (Signed)
[facility]    CSN: [id number] Arrival date & time: 03/22/16  [id number]  First Provider Contact:  First MD Initiated Contact with Patient 03/22/16 1944        History   Chief Complaint Chief Complaint  Patient presents with  . Laceration    HPI [name] is a 50 y.o. female.    Laceration  Location:  Finger Finger laceration location:  L index finger Length:  0.5 Depth:  Cutaneous Quality: avulsion   Bleeding: controlled   Time since incident:  2 hours Laceration mechanism:  Knife (fixing food for dogs and cut fingertip .) Pain details:    Progression:  Unchanged Foreign body present:  No foreign bodies Worsened by:  Nothing Tetanus status:  Up to date   Past Medical History:  Diagnosis Date  . Anxiety   . Cataract    bilateral  . Chest pain   . CTS (carpal tunnel syndrome)   . Diabetes mellitus without complication (HCC)   . Dizziness   . Gout   . SOB (shortness of breath)     Patient Active Problem List   Diagnosis Date Noted  . Skin abnormality 08/09/2015  . Cervical cancer screening 01/25/2014  . Health care maintenance 01/10/2014  . DM type 2 (diabetes mellitus, type 2) (HCC) 07/10/2010  . Generalized anxiety disorder 07/10/2010  . DEPRESSION 07/10/2010  . HEARING LOSS, LEFT EAR 07/10/2010  . ALLERGIC RHINITIS 07/10/2010  . MENOPAUSAL SYNDROME 07/10/2010  . HEADACHE 07/10/2010  . RENAL CALCULUS, HX OF 07/10/2010    Past Surgical History:  Procedure Laterality Date  . CATARACT EXTRACTION    . Neck fusion C5-6    . TONSILECTOMY, ADENOIDECTOMY, BILATERAL MYRINGOTOMY AND TUBES      OB History    No data available       Home Medications    Prior to Admission medications   Medication Sig Start Date End Date Taking? Authorizing Provider  acetaminophen (TYLENOL) 325 MG tablet Take 650 mg by mouth every 6 (six) hours as needed.    Historical Provider, MD  ipratropium (ATROVENT) 0.06 % nasal spray Place 2 sprays into both  nostrils 4 (four) times daily. 08/02/15   Hayden Rasmussenavid Mabe, NP  OVER THE COUNTER MEDICATION Cough syrup and decongestant.    Historical Provider, MD  venlafaxine XR (EFFEXOR-XR) 150 MG 24 hr capsule TAKE ONE CAPSULE BY MOUTH DAILY WITH BREAKFAST 01/09/16   Renne Muscaaniel L Warden, MD    Family History No family history on file.  Social History Social History  Substance Use Topics  . Smoking status: Former Smoker    Types: Cigarettes    Quit date: 10/30/1990  . Smokeless tobacco: Not on file  . Alcohol use No     Allergies   Shellfish allergy and Sulfonamide derivatives   Review of Systems Review of Systems  Skin: Positive for wound.  All other systems reviewed and are negative.    Physical Exam Triage Vital Signs ED Triage Vitals  Enc Vitals Group     BP      Pulse      Resp      Temp      Temp src      SpO2      Weight      Height      Head Circumference      Peak Flow      Pain Score      Pain Loc      Pain Edu?  Excl. in GC?    No data found.   Updated Vital Signs There were no vitals taken for this visit.  Visual Acuity Right Eye Distance:   Left Eye Distance:   Bilateral Distance:    Right Eye Near:   Left Eye Near:    Bilateral Near:     Physical Exam  Constitutional: She appears well-developed and well-nourished. She appears distressed.  Skin: Skin is warm and dry.  Near complete avulsion of flap of fingertip skin., tiny involvement of nail tip, volar skin intact, nvt intact.  Nursing note and vitals reviewed.    UC Treatments / Results  Labs (all labs ordered are listed, but only abnormal results are displayed) Labs Reviewed - No data to display  EKG  EKG Interpretation None       Radiology No results found.  Procedures Procedures (including critical care time)  Medications Ordered in UC Medications - No data to display   Initial Impression / Assessment and Plan / UC Course  I have reviewed the triage vital signs and the nursing  notes.  Pertinent labs & imaging results that were available during my care of the patient were reviewed by me and considered in my medical decision making (see chart for details).  Clinical Course      Final Clinical Impressions(s) / UC Diagnoses   Final diagnoses:  None    New Prescriptions New Prescriptions   No medications on file     Linna Hoff, MD 03/22/16 2053

## 2016-03-22 NOTE — ED Triage Notes (Signed)
Pt reports     Symptoms  Of  Laceration      To  The  Tip  Of  Her    l  Index  Finger      While   Cutting     Her    Finger      With      A  Knife

## 2016-03-22 NOTE — Discharge Instructions (Signed)
Care as discussed, leave bandaged until mon.

## 2016-03-22 NOTE — ED Notes (Signed)
Wound  Care  And  Dressing  Applied

## 2016-08-13 ENCOUNTER — Encounter: Payer: Self-pay | Admitting: Family Medicine

## 2016-08-13 ENCOUNTER — Ambulatory Visit (INDEPENDENT_AMBULATORY_CARE_PROVIDER_SITE_OTHER): Payer: PRIVATE HEALTH INSURANCE | Admitting: Family Medicine

## 2016-08-13 DIAGNOSIS — R42 Dizziness and giddiness: Secondary | ICD-10-CM

## 2016-08-13 DIAGNOSIS — R519 Headache, unspecified: Secondary | ICD-10-CM

## 2016-08-13 DIAGNOSIS — R51 Headache: Secondary | ICD-10-CM

## 2016-08-13 DIAGNOSIS — F411 Generalized anxiety disorder: Secondary | ICD-10-CM

## 2016-08-13 MED ORDER — SUMATRIPTAN SUCCINATE 25 MG PO TABS: 25.0000 mg | ORAL_TABLET | ORAL | 0 refills | Status: DC | PRN

## 2016-08-13 MED ORDER — MECLIZINE HCL 32 MG PO TABS: 32.0000 mg | ORAL_TABLET | Freq: Three times a day (TID) | ORAL | 0 refills | Status: DC | PRN

## 2016-08-13 MED ORDER — VENLAFAXINE HCL ER 75 MG PO CP24: ORAL_CAPSULE | ORAL | 6 refills | Status: DC

## 2016-08-13 NOTE — Patient Instructions (Signed)
You were seen in the clinic today for concern for migraines and vertigo symptoms.  I think some of it may be related to anxiety and so we increased your effexor to 225mg  daily (take 3 75mg  pills per day).  I have also prescribed you imitrex that you can take if you have a migraine.  You can take one pill and if not relieved take another 2 hours later. Also, prescribed meclizine that you can take as needed for dizziness.   Please follow up in one month for a general medical visit.   Very nice seeing you!  Geetika Laborde L. Myrtie SomanWarden, MD [facility] Resident PGY-1 08/13/2016 5:13 PM

## 2016-08-15 ENCOUNTER — Encounter: Payer: Self-pay | Admitting: Family Medicine

## 2016-08-15 DIAGNOSIS — R42 Dizziness and giddiness: Secondary | ICD-10-CM | POA: Insufficient documentation

## 2016-08-15 NOTE — Assessment & Plan Note (Addendum)
Reports worsening anxiety recently with GAD-7 of 16. Currently on lexapro 150mg  daily and agrees to starting at an increased dose. Recommendations to increase in 75mg  increments every 4 days.  - will increase to 225mg  daily and follow up in one month - will encourage f/u at behavioral health clinic

## 2016-08-15 NOTE — Assessment & Plan Note (Signed)
Headaches without red flag signs/symptoms that are unrelieved with tylenol.  Unclear if these are migraines.  - imitrex PRN - plan to follow up in one month

## 2016-08-15 NOTE — Progress Notes (Signed)
    Subjective:  Joy Bautista is a 51 y.o. female who [facility] today with multiple chief complaints including migraine and dizziness  HPI:  Headache:  Reports headaches without associated nausea or vomiting or worsening when supine. She has no changes in vision or auras and denies trauma.  Recently headaches do not respond to tylenol or ibuprofen.  Denies any numbness or tingling in extremities, weakness or loss of bladder or bowel function.   Dizziness: Subacute problem that is worsened with changes in head position. During these episodes she feels like the room is spinning.  She denies any recent illnesses.  She does endorse changes in her hearing and chronic tinnitus.  She denies chronic aspirin use.  Had formal hearing tests performed, which revealed no significant deficits in either ear.    Generalized anxiety:  Taking lexapro 150mg  daily and reports increased anxiety lately.  She feels as if the medication is not working and denies depressive symptoms. There significant changes in her life recently and is unable to explain why she may be feeling worse.   PMH: headaches, depression/anxiety Tobacco use: former smoker, quit in 1992 Medication: reviewed and updated ROS: see HPI   Objective:  Physical Exam: BP 138/85 (BP Location: Left Arm, Patient Position: Sitting, Cuff Size: Normal)   Pulse (!) 107   Temp 97.9 F (36.6 C) (Oral)   Ht 5\' 5"  (1.651 m)   Wt 58.4 kg (128 lb 12.8 oz)   LMP 03/22/2016   SpO2 97%   BMI 21.43 kg/m   Gen: 50yo F in NAD, resting comfortably CV: RRR with no murmurs appreciated Pulm: NWOB, CTAB with no crackles, wheezes, or rhonchi GI: Normal bowel sounds present. Soft, Nontender, Nondistended. MSK: no edema, cyanosis, or clubbing noted, FROM, 5/5 strength in upper and lower extremities Skin: warm, dry Neuro: CN 2-10 WNL, no changes in sensation Psych: Normal affect and thought content  No results found for this or any previous visit  (from the past 72 hour(s)).   Assessment/Plan:  Headache Headaches without red flag signs/symptoms that are unrelieved with tylenol.  Unclear if these are migraines.  - imitrex PRN - plan to follow up in one month  Generalized anxiety disorder Reports worsening anxiety recently with GAD-7 of 16. Currently on lexapro 150mg  daily and agrees to starting at an increased dose. Recommendations to increase in 75mg  increments every 4 days.  - will increase to 225mg  daily and follow up in one month - will encourage f/u at behavioral health clinic   Dizziness Signs and symptoms point to more of a peripheral picture than central.  I suspect that her worsening anxiety is also playing a part and have increased her lexapro to 225mg  daily with hopes that her symptoms will improve.  - lexapro 225 mg daily - meclizine PRN  - f/u one month

## 2016-08-15 NOTE — Assessment & Plan Note (Signed)
Signs and symptoms point to more of a peripheral picture than central.  I suspect that her worsening anxiety is also playing a part and have increased her lexapro to 225mg  daily with hopes that her symptoms will improve.  - lexapro 225 mg daily - meclizine PRN  - f/u one month

## 2016-09-16 ENCOUNTER — Telehealth: Payer: Self-pay | Admitting: Family Medicine

## 2016-09-16 ENCOUNTER — Other Ambulatory Visit: Payer: Self-pay | Admitting: Family Medicine

## 2016-09-16 NOTE — Telephone Encounter (Signed)
Pt states Rx for Venlafaxine was written incorrectly. IT was suppose to be increased to 225 mg, 3 pills a day. Pt [facility] [address]

## 2016-09-16 NOTE — Telephone Encounter (Signed)
Will forward to PCP.  Martin, Tamika L, RN  

## 2016-09-18 NOTE — Telephone Encounter (Signed)
Left message for Joy Bautista to let her know that she is correct for taking 225mg  daily (75mg x3).  I spoke with her pharmacist and corrected this so that next time she picks up a prescription this will be on her bottle.

## 2016-09-29 ENCOUNTER — Ambulatory Visit (INDEPENDENT_AMBULATORY_CARE_PROVIDER_SITE_OTHER): Payer: PRIVATE HEALTH INSURANCE | Admitting: Family Medicine

## 2016-09-29 ENCOUNTER — Encounter: Payer: Self-pay | Admitting: Family Medicine

## 2016-09-29 DIAGNOSIS — F411 Generalized anxiety disorder: Secondary | ICD-10-CM | POA: Diagnosis not present

## 2016-09-29 NOTE — Patient Instructions (Signed)
You were seen today to follow up after increasing your medication.  I am so glad that you are feeling better!    Thank you for coming in today.  If you have any questions or concerns please call the office.   Take care, Joy Bautista L. Myrtie SomanWarden, MD 09/29/2016 4:20 PM

## 2016-10-02 NOTE — Progress Notes (Signed)
    Subjective:  Joy Bautista is a 51 y.o. female who [facility] to follow-up after medication changes  HPI:  Generalized anxiety disorder: Patient was seen in clinic about 6 weeks ago reporting worsening anxiety on Lexapro 150 mg daily with GAD-7 of 16.  Increased her Lexapro dose to 225 mg daily. Reports that she feels significantly better and repeat GAD-7 was 6. Denies any side effects with the increased dose. Denies any chest pain, shortness of breath, nausea, vomiting or diarrhea.    PMH: Generalized anxiety, headaches, dizziness Tobacco use: Former smoker Medication: reviewed and updated ROS: see HPI   Objective:  Physical Exam: BP 118/62   Pulse 98   Temp 97.8 F (36.6 C) (Oral)   Wt 129 lb (58.5 kg)   LMP 03/22/2016   SpO2 98%   BMI 21.47 kg/m   Gen: 51 year old female in NAD, resting comfortably CV: RRR with no murmurs appreciated Pulm: NWOB, CTAB with no crackles, wheezes, or rhonchi GI: Normal bowel sounds present. Soft, Nontender, Nondistended. MSK: no edema, cyanosis, or clubbing noted Skin: warm, dry Neuro: grossly normal, moves all extremities Psych: Normal affect and thought content  No results found for this or any previous visit (from the past 72 hour(s)).   Assessment/Plan:  Generalized anxiety disorder Reports significant improvement over the last 6 weeks with increased Lexapro dose.  Denies any symptoms or side effects.  GAD-7 decrease from 16 to 6.   - Continue with Lexapro 225 mg daily - Encourage patient to follow-up at behavioral health clinic

## 2016-10-02 NOTE — Assessment & Plan Note (Signed)
Reports significant improvement over the last 6 weeks with increased Lexapro dose.  Denies any symptoms or side effects.  GAD-7 decrease from 16 to 6.   - Continue with Lexapro 225 mg daily - Encourage patient to follow-up at behavioral health clinic

## 2016-10-21 ENCOUNTER — Telehealth: Payer: Self-pay | Admitting: *Deleted

## 2016-10-21 ENCOUNTER — Encounter: Payer: Self-pay | Admitting: Family Medicine

## 2016-10-21 ENCOUNTER — Ambulatory Visit (INDEPENDENT_AMBULATORY_CARE_PROVIDER_SITE_OTHER): Payer: PRIVATE HEALTH INSURANCE | Admitting: Family Medicine

## 2016-10-21 VITALS — BP 148/78 | HR 79 | Temp 97.7°F | Ht 65.0 in | Wt 127.0 lb

## 2016-10-21 DIAGNOSIS — R519 Headache, unspecified: Secondary | ICD-10-CM

## 2016-10-21 DIAGNOSIS — R51 Headache: Secondary | ICD-10-CM | POA: Diagnosis not present

## 2016-10-21 DIAGNOSIS — R42 Dizziness and giddiness: Secondary | ICD-10-CM

## 2016-10-21 MED ORDER — NAPROXEN 500 MG PO TABS: 500.0000 mg | ORAL_TABLET | Freq: Two times a day (BID) | ORAL | 2 refills | Status: DC

## 2016-10-21 NOTE — Telephone Encounter (Signed)
Patient calls stating she is having another bad migraine and the medication prescribed has not helped at all, she is also having very bad vertigo. Wants to know if MD wants to change medication.

## 2016-10-21 NOTE — Progress Notes (Signed)
    Subjective:  Joy Bautista is a 51 y.o. female who presents to the [facility] today with a chief complaint of migraine and vertigo  HPI: Headache:  Woke up yesterday for work and head was pounding. Headache is bilateral and nonpulsatile and lasted several hours throughout the day. Took an Imitrex and this did not help.  She did have associated vomiting after taking her daily Lexapro, but was able to go outside to walk her dog.  Called into work and attempted to go to bed and this did not help.  Took an additional Imitrex and then went to bed and this seemed to help with her headache. Reports that she has been taking around 4 ibuprofen and 4 Tylenol daily for the past several months. Denied any vision changes or chest pain at that time.   Dizziness:  Woke up this morning with symptoms of dizziness and was unsteady at work and people had to help her walk.  Felt like the room was spinning. Denies any recent sick exposures, cough, congestion or fevers. Had some relief with meclizine. Does seem to be related to position. Dix-Hallpike was negative at last visit.   PMH: Generalized anxiety, headaches, dizziness  Tobacco use: former smoker Medication: reviewed and updated ROS: see HPI   Objective:  Physical Exam: BP (!) 148/78   Pulse 79   Temp 97.7 F (36.5 C) (Oral)   Ht  (1.651 m)   Wt 57.6 kg (127 lb)   LMP 03/22/2016   SpO2 98%   BMI 21.13 kg/m   Gen: 51yo F in NAD, resting comfortably HEENT: No sinus tenderness, nasal congestion, moist mucous membranes and clear oropharynx CV: RRR with no murmurs appreciated Pulm: NWOB, CTAB with no crackles, wheezes, or rhonchi GI: Normal bowel sounds present. Soft, Nontender, Nondistended. MSK: no edema, cyanosis, or clubbing noted Skin: warm, dry Neuro: CN2-12 WNL, no changes in sensation, strength 5/5 throughout.  Psych: Normal affect and thought content  No results found for this or any previous visit (from the past 72  hour(s)).   Assessment/Plan:  Headache Patient has no red flags associated with her headaches.  Unlikely to be migraine given the nonpulsatile and bilateral presentation and the fact that headache was non-debilitating. Patient does chronically take over-the-counter medication for various aches and pains including 4 ibuprofen 4 Tylenol daily and this could represent a rebounding headache. Normally would consider preventative medication like TCA, however patient was started on Lexapro which could help with headache. - Discontinue Tylenol and ibuprofen - Start naproxen 500 mg twice a day as an abortive - Recommend stress management - Follow-up in one month and titrate naproxen  Dizziness Episode of dizziness that started this morning and felt like room is spinning and did seem to be positional related. Did have some relief with meclizine.  I believe her anxiety is contributing to her symptoms as well.  - Referred to vestibular rehabilitation - Continue meclizine when necessary - Return precautions discussed with patient

## 2016-10-21 NOTE — Patient Instructions (Signed)
Thanks for coming in to see me today.  I believe your headache may be due to the amount of over the counter medication that you are taking.  I am recommending that you stop the ibuprofen and tylenol and take naproxen  twice daily instead.  I have given you a prescription for this. Please see me in the office in one month.   I have also put in a referral to vestibular rehab to help with your dizziness.  If you do not hear back in two weeks with an appointment please call the office.   Very nice seeing you today!  Mainor Hellmann L. Myrtie Soman, MD [facility] Resident PGY-1 10/21/2016 4:00 PM

## 2016-10-22 NOTE — Assessment & Plan Note (Signed)
Patient has no red flags associated with her headaches.  Unlikely to be migraine given the nonpulsatile and bilateral presentation and the fact that headache was non-debilitating. Patient does chronically take over-the-counter medication for various aches and pains including 4 ibuprofen 4 Tylenol daily and this could represent a rebounding headache. Normally would consider preventative medication like TCA, however patient was started on Lexapro which could help with headache. - Discontinue Tylenol and ibuprofen - Start naproxen 500 mg twice a day as an abortive - Recommend stress management - Follow-up in one month and titrate naproxen

## 2016-10-22 NOTE — Assessment & Plan Note (Signed)
Episode of dizziness that started this morning and felt like room is spinning and did seem to be positional related. Did have some relief with meclizine.  I believe her anxiety is contributing to her symptoms as well.  - Referred to vestibular rehabilitation - Continue meclizine when necessary - Return precautions discussed with patient

## 2016-11-19 ENCOUNTER — Other Ambulatory Visit: Payer: Self-pay | Admitting: *Deleted

## 2016-11-20 MED ORDER — VENLAFAXINE HCL ER 75 MG PO CP24: ORAL_CAPSULE | ORAL | 6 refills | Status: DC

## 2016-11-24 MED ORDER — VENLAFAXINE HCL ER 75 MG PO CP24: ORAL_CAPSULE | ORAL | 6 refills | Status: DC

## 2016-11-24 NOTE — Addendum Note (Signed)
Addended by: Clovis PuMARTIN, Garhett Bernhard L on: 11/24/2016 08:15 AM   Modules accepted: Orders

## 2017-04-09 ENCOUNTER — Encounter (HOSPITAL_COMMUNITY): Payer: Self-pay | Admitting: Family Medicine

## 2017-04-09 ENCOUNTER — Ambulatory Visit (HOSPITAL_COMMUNITY)
Admission: EM | Admit: 2017-04-09 | Discharge: 2017-04-09 | Disposition: A | Discharge status: Home / Self Care | Payer: PRIVATE HEALTH INSURANCE | Attending: Family Medicine | Admitting: Family Medicine

## 2017-04-09 DIAGNOSIS — S8002XA Contusion of left knee, initial encounter: Secondary | ICD-10-CM

## 2017-04-09 DIAGNOSIS — W19XXXA Unspecified fall, initial encounter: Secondary | ICD-10-CM | POA: Diagnosis not present

## 2017-04-09 DIAGNOSIS — T148XXA Other injury of unspecified body region, initial encounter: Secondary | ICD-10-CM

## 2017-04-09 MED ORDER — NAPROXEN 500 MG PO TABS: 500.0000 mg | ORAL_TABLET | Freq: Two times a day (BID) | ORAL | 0 refills | Status: DC | PRN

## 2017-04-09 NOTE — ED Provider Notes (Signed)
[facility]    CSN: [id number] Arrival date & time: 04/09/17  1141     History   Chief Complaint Chief Complaint  Patient presents with  . Fall    HPI Joy Bautista is a 51 y.o. female.   HPI: 51 y/o female presented to clinic S/P fall and landed on left side. C/O pain to left shoulder, left wrist, left knee and left ankle. No visible swelling, bruising , ecchymosis appreciated. Good ROM in shoulder, wrist, knee and ankle. Small superficial abrasion noted to left ankle. Gait steady.   Past Medical History:  Diagnosis Date  . Anxiety   . Cataract    bilateral  . Chest pain   . CTS (carpal tunnel syndrome)   . Diabetes mellitus without complication (HCC)   . Dizziness   . Gout   . SOB (shortness of breath)     Patient Active Problem List   Diagnosis Date Noted  . Dizziness 08/15/2016  . Skin abnormality 08/09/2015  . Cervical cancer screening 01/25/2014  . Health care maintenance 01/10/2014  . Generalized anxiety disorder 07/10/2010  . DEPRESSION 07/10/2010  . HEARING LOSS, LEFT EAR 07/10/2010  . ALLERGIC RHINITIS 07/10/2010  . MENOPAUSAL SYNDROME 07/10/2010  . Headache 07/10/2010  . RENAL CALCULUS, HX OF 07/10/2010    Past Surgical History:  Procedure Laterality Date  . CATARACT EXTRACTION    . Neck fusion C5-6    . TONSILECTOMY, ADENOIDECTOMY, BILATERAL MYRINGOTOMY AND TUBES      OB History    No data available       Home Medications    Prior to Admission medications   Medication Sig Start Date End Date Taking? Authorizing Provider  acetaminophen (TYLENOL) 325 MG tablet Take 650 mg by mouth every 6 (six) hours as needed.    [provider]  ipratropium (ATROVENT) 0.06 % nasal spray Place 2 sprays into both nostrils 4 (four) times daily. 08/02/15   Hayden Rasmussen, NP  meclizine (ANTIVERT) 32 MG tablet Take 1 tablet (32 mg total) by mouth 3 (three) times daily as needed. 08/13/16   Renne Musca, MD  naproxen (NAPROSYN) 500 MG  tablet Take 1 tablet (500 mg total) by mouth 2 (two) times daily as needed (Take with Food). 04/09/17   Henning Ehle, NP  OVER THE COUNTER MEDICATION Cough syrup and decongestant.    [provider]  SUMAtriptan (IMITREX) 25 MG tablet Take 1 tablet (25 mg total) by mouth every 2 (two) hours as needed for migraine. May repeat in 2 hours if headache persists or recurs. 08/13/16   Renne Musca, MD  venlafaxine XR (EFFEXOR-XR) 75 MG 24 hr capsule TAKE ONE CAPSULE BY MOUTH DAILY WITH BREAKFAST 11/24/16   Renne Musca, MD    Family History History reviewed. No pertinent family history.  Social History Social History  Substance Use Topics  . Smoking status: Former Smoker    Types: Cigarettes    Quit date: 10/30/1990  . Smokeless tobacco: Never Used  . Alcohol use No     Allergies   Shellfish allergy and Sulfonamide derivatives   Review of Systems Review of Systems  Constitutional: Negative.   HENT: Negative.   Respiratory: Negative.   Cardiovascular: Negative.   Gastrointestinal: Negative.   Musculoskeletal: Negative for joint swelling and neck pain.       Mild pain to palpation to lateral aspect of Right knee. No pain to shoulder, wrist and ankle. Good ROM and strength in all extremities. Gait  steady.     Physical Exam Triage Vital Signs ED Triage Vitals  Enc Vitals Group     BP 04/09/17 1214 (!) 145/69     Pulse Rate 04/09/17 1214 96     Resp 04/09/17 1214 18     Temp 04/09/17 1214 98.2 F (36.8 C)     Temp src --      SpO2 04/09/17 1214 100 %     Weight --      Height --      Head Circumference --      Peak Flow --      Pain Score 04/09/17 1215 5     Pain Loc --      Pain Edu? --      Excl. in GC? --    No data found.   Updated Vital Signs BP (!) 145/69   Pulse 96   Temp 98.2 F (36.8 C)   Resp 18   LMP 03/22/2016   SpO2 100%   Visual Acuity Right Eye Distance:   Left Eye Distance:   Bilateral Distance:    Right Eye Near:   Left  Eye Near:    Bilateral Near:     Physical Exam  Constitutional: She is oriented to person, place, and time. She appears well-developed and well-nourished. No distress.  HENT:  Head: Normocephalic.  Eyes: Pupils are equal, round, and reactive to light.  Neck: Normal range of motion.  Cardiovascular: Normal rate, regular rhythm and normal heart sounds.   Pulmonary/Chest: Effort normal and breath sounds normal.  Musculoskeletal: She exhibits tenderness (Mild tenderness to palpation to lateral aspect of left knee. Normal ROM. No crepitus or deformity appreciated. Gait steady. ). She exhibits no edema or deformity.  Neurological: She is alert and oriented to person, place, and time.  Skin: Rash (Small superficial abrasion noted to left ankle. Non tender to palpation ) noted.     UC Treatments / Results  Labs (all labs ordered are listed, but only abnormal results are displayed) Labs Reviewed - No data to display  EKG  EKG Interpretation None       Radiology No results found.  Procedures Procedures (including critical care time)  Medications Ordered in UC Medications - No data to display   Initial Impression / Assessment and Plan / UC Course  I have reviewed the triage vital signs and the nursing notes.  Pertinent labs & imaging results that were available during my care of the patient were reviewed by me and considered in my medical decision making (see chart for details).   Keep ace wrap on. Avoid prolong standing. Superficial abrasion to left ankle. No Tx required for abrasion to ankle  Final Clinical Impressions(s) / UC Diagnoses   Final diagnoses:  Contusion of left knee, initial encounter  Skin abrasion    New Prescriptions New Prescriptions   NAPROXEN (NAPROSYN) 500 MG TABLET    Take 1 tablet (500 mg total) by mouth 2 (two) times daily as needed (Take with Food).     Controlled Substance Prescriptions [place] Controlled Substance Registry consulted? Not  Applicable   Reinaldo Raddle, NP 04/09/17 1254

## 2017-04-09 NOTE — ED Triage Notes (Signed)
Pt here for fall a few days ago and now she is hurting all on her left side. sts that she slipped on some dog slobber.

## 2017-04-09 NOTE — Discharge Instructions (Signed)
Avoid prolong standing. Keep ace wrap on x 2 weeks.

## 2017-09-14 ENCOUNTER — Ambulatory Visit (INDEPENDENT_AMBULATORY_CARE_PROVIDER_SITE_OTHER): Payer: Self-pay | Admitting: Family Medicine

## 2017-09-14 ENCOUNTER — Other Ambulatory Visit: Payer: Self-pay

## 2017-09-14 ENCOUNTER — Encounter: Payer: Self-pay | Admitting: Family Medicine

## 2017-09-14 ENCOUNTER — Ambulatory Visit: Payer: PRIVATE HEALTH INSURANCE | Admitting: Family Medicine

## 2017-09-14 VITALS — BP 138/70 | HR 84 | Temp 98.3°F | Ht 65.0 in | Wt 135.0 lb

## 2017-09-14 DIAGNOSIS — Z1239 Encounter for other screening for malignant neoplasm of breast: Secondary | ICD-10-CM

## 2017-09-14 DIAGNOSIS — F411 Generalized anxiety disorder: Secondary | ICD-10-CM

## 2017-09-14 MED ORDER — VENLAFAXINE HCL ER 150 MG PO CP24: ORAL_CAPSULE | ORAL | 1 refills | Status: DC

## 2017-09-14 NOTE — Patient Instructions (Signed)
Thank you for coming to see me today. It was a pleasure meeting you! Today we talked about:   Your medications. I have increased the dose of your Effexor to 150 mg daily.   You are due for a pap-smear, please schedule this at the front desk. You are also due for a mammogram. Please have this done at your earliest convenience.  Please follow-up with me in 4-6 weeks or sooner as needed.  If you have any questions or concerns, please do not hesitate to call the office at [phone].  Take Care,   [place] Marelin Tat, DO

## 2017-09-14 NOTE — Progress Notes (Addendum)
   Subjective:    Patient ID: Joy Bautista, female    DOB: 06-06-1966, 52 y.o.   MRN: [id number]   CC: refill and depression/ anxiety  HPI: GAD/ Depression: - Patient with recent family stressors including recent death of brother and father being on Hospice. - Patient reports no SI or HI - Last seen in clinic on 10/21/2016 and has been happy with her current regimen of Effexor, but would like something extra now because she is feeling more depressed due to her social situation  Smoking status reviewed- Former smoker  Review of Systems Per HPI, also denies recent illness, chest pain, shortness of breath, abdominal pain, N/V/D, weakness   Patient Active Problem List   Diagnosis Date Noted  . Dizziness 08/15/2016  . Skin abnormality 08/09/2015  . Cervical cancer screening 01/25/2014  . Health care maintenance 01/10/2014  . Generalized anxiety disorder 07/10/2010  . DEPRESSION 07/10/2010  . HEARING LOSS, LEFT EAR 07/10/2010  . ALLERGIC RHINITIS 07/10/2010  . MENOPAUSAL SYNDROME 07/10/2010  . Headache 07/10/2010  . RENAL CALCULUS, HX OF 07/10/2010     Objective:  BP 138/70   Pulse 84   Temp 98.3 F (36.8 C) (Oral)   Ht 5\' 5"  (1.651 m)   Wt 135 lb (61.2 kg)   LMP 03/22/2016   SpO2 98%   BMI 22.47 kg/m  Vitals and nursing note reviewed  General: NAD, pleasant Cardiac: RRR, normal heart sounds, no murmurs Respiratory: CTAB, normal effort Abdomen: soft, nontender, nondistended. Bowel sounds present Extremities: no edema or cyanosis. WWP. Skin: warm and dry, no rashes noted Neuro: alert and oriented, no focal deficits Psych: normal affect  GAD 7 : Generalized Anxiety Score 09/14/2017 09/29/2016 08/13/2016 01/09/2016  Nervous, Anxious, on Edge 3 1 3  0  Control/stop worrying 1 1 2  0  Worry too much - different things 2 2 2  0  Trouble relaxing 2 0 2 0  Restless 2 1 2 1   Easily annoyed or irritable 2 1 3  0  Afraid - awful might happen 0 0 2 0  Total GAD 7 Score 12 6 16 1    Anxiety Difficulty - Somewhat difficult Somewhat difficult Not difficult at all    [facility] 09/14/2017 10/21/2016 09/29/2016  Decreased Interest 2 0 0  Down, Depressed, Hopeless 2 0 0  PHQ - 2 Score 4 0 0  Altered sleeping 0 - -  Tired, decreased energy 2 - -  Change in appetite 0 - -  Feeling bad or failure about yourself  2 - -  Trouble concentrating 2 - -  Moving slowly or fidgety/restless 2 - -  Suicidal thoughts 0 - -  PHQ-9 Score 12 - -    Assessment & Plan:    Generalized anxiety disorder Will increase current dose of Effexor to 150 mg daily from 75 mg, as this has been very helpful for patient. She reports that Hospice has offered her grief counseling and I also informed her of our behavioral counseling due to her recent loss and her dad being on comfort care. Patient encouraged to seek help if she feels the need to harm herself or others.   Health maintenance: Patient instructed to schedule her Pap smear and Mammogram. BP 138/80 today. Will follow up on patient's blood pressure at return visit in 4-6 weeks to ensure it is not elevated.   [place] Ginette Bradway, DO Family Medicine Resident PGY-1

## 2017-09-16 NOTE — Assessment & Plan Note (Signed)
Will increase current dose of Effexor to 150 mg daily from 75 mg, as this has been very helpful for patient. She reports that Hospice has offered her grief counseling and I also informed her of our behavioral counseling due to her recent loss and her dad being on comfort care. Patient encouraged to seek help if she feels the need to harm herself or others.

## 2018-01-11 ENCOUNTER — Other Ambulatory Visit: Payer: Self-pay

## 2018-01-11 ENCOUNTER — Ambulatory Visit (INDEPENDENT_AMBULATORY_CARE_PROVIDER_SITE_OTHER): Payer: BLUE CROSS/BLUE SHIELD | Admitting: Family Medicine

## 2018-01-11 ENCOUNTER — Encounter: Payer: Self-pay | Admitting: Family Medicine

## 2018-01-11 ENCOUNTER — Ambulatory Visit (HOSPITAL_COMMUNITY)
Admission: RE | Admit: 2018-01-11 | Discharge: 2018-01-11 | Disposition: A | Discharge status: Home / Self Care | Payer: BLUE CROSS/BLUE SHIELD | Source: Ambulatory Visit | Attending: Family Medicine | Admitting: Family Medicine

## 2018-01-11 VITALS — BP 134/78 | HR 112 | Temp 98.6°F | Ht 65.0 in | Wt 133.0 lb

## 2018-01-11 DIAGNOSIS — R000 Tachycardia, unspecified: Secondary | ICD-10-CM | POA: Diagnosis not present

## 2018-01-11 DIAGNOSIS — R079 Chest pain, unspecified: Secondary | ICD-10-CM | POA: Diagnosis not present

## 2018-01-11 DIAGNOSIS — F411 Generalized anxiety disorder: Secondary | ICD-10-CM

## 2018-01-11 NOTE — Patient Instructions (Addendum)
Your EKG looks okay today, no signs of heart attack.  Please go to the emergency room if you have another episode of chest pain.  For your panic attacks please make an appointment with the front desk to meet with Behavioral Health for counseling.   We are checking some labs today. If results require attention, either myself or my nurse will get in touch with you. If everything is normal, you will get a letter in the mail or a message in My Chart. Please give [place] a call if you do not hear from [place] after 2 weeks.  Please bring all of your medications with you to each visit.   Sign up for My Chart to have easy access to your labs results, and communication with your primary care physician.  Feel free to call with any questions or concerns at any time, at [phone].   Take care,  Dr. Leland Her, DO [facility]  Panic Attack A panic attack is a sudden episode of severe anxiety, fear, or discomfort that causes physical and emotional symptoms. The attack may be in response to something frightening, or it may occur for no known reason. Symptoms of a panic attack can be similar to symptoms of a heart attack or stroke. It is important to see your health care provider when you have a panic attack so that these conditions can be ruled out. A panic attack is a symptom of another condition. Most panic attacks go away with treatment of the underlying problem. If you have panic attacks often, you may have a condition called panic disorder. What are the causes? A panic attack may be caused by:  An extreme, life-threatening situation, such as a war or natural disaster.  An anxiety disorder, such as post-traumatic stress disorder.  Depression.  Certain medical conditions, including heart problems, neurological conditions, and infections.  Certain over-the-counter and prescription medicines.  Illegal drugs that increase heart rate and blood pressure, such as  methamphetamine.  Alcohol.  Supplements that increase anxiety.  Panic disorder.  What increases the risk? You are more likely to develop this condition if:  You have an anxiety disorder.  You have another mental health condition.  You take certain medicines.  You use alcohol, illegal drugs, or other substances.  You are under extreme stress.  A life event is causing increased feelings of anxiety and depression.  What are the signs or symptoms? A panic attack starts suddenly, usually lasts about 20 minutes, and occurs with one or more of the following:  A pounding heart.  A feeling that your heart is beating irregularly or faster than normal (palpitations).  Sweating.  Trembling or shaking.  Shortness of breath or feeling smothered.  Feeling choked.  Chest pain or discomfort.  Nausea or a strange feeling in your stomach.  Dizziness, feeling lightheaded, or feeling like you might faint.  Chills or hot flashes.  Numbness or tingling in your lips, hands, or feet.  Feeling confused, or feeling that you are not yourself.  Fear of losing control or being emotionally unstable.  Fear of dying.  How is this diagnosed? A panic attack is diagnosed with an assessment by your health care provider. During the assessment your health care provider will ask questions about:  Your history of anxiety, depression, and panic attacks.  Your medical history.  Whether you drink alcohol, use illegal drugs, take supplements, or take medicines. Be honest about your substance use.  Your health care provider may also:  Order blood tests or other kinds of tests to rule out serious medical conditions.  Refer you to a mental health professional for further evaluation.  How is this treated? Treatment depends on the cause of the panic attack:  If the cause is a medical problem, your health care provider will either treat that problem or refer you to a specialist.  If the cause  is emotional, you may be given anti-anxiety medicines or referred to a counselor. These medicines may reduce how often attacks happen, reduce how severe the attacks are, and lower anxiety.  If the cause is a medicine, your health care provider may tell you to stop the medicine, change your dose, or take a different medicine.  If the cause is a drug, treatment may involve letting the drug wear off and taking medicine to help the drug leave your body or to counteract its effects. Attacks caused by drug abuse may continue even if you stop using the drug.  Follow these instructions at home:  Take over-the-counter and prescription medicines only as told by your health care provider.  If you feel anxious, limit your caffeine intake.  Take good care of your physical and mental health by: ? Eating a balanced diet that includes plenty of fresh fruits and vegetables, whole grains, lean meats, and low-fat dairy. ? Getting plenty of rest. Try to get 7-8 hours of uninterrupted sleep each night. ? Exercising regularly. Try to get 30 minutes of physical activity at least 5 days a week. ? Not smoking. Talk to your health care provider if you need help quitting. ? Limiting alcohol intake to no more than 1 drink a day for nonpregnant women and 2 drinks a day for men. One drink equals 12 oz of beer, 5 oz of wine, or 1 oz of hard liquor.  Keep all follow-up visits as told by your health care provider. This is important. Panic attacks may have underlying physical or emotional problems that take time to accurately diagnose. Contact a health care provider if:  Your symptoms do not improve, or they get worse.  You are not able to take your medicine as prescribed because of side effects. Get help right away if:  You have serious thoughts about hurting yourself or others.  You have symptoms of a panic attack. Do not drive yourself to the hospital. Have someone else drive you or call an ambulance. If you ever  feel like you may hurt yourself or others, or you have thoughts about taking your own life, get help right away. You can go to your nearest emergency department or call:  Your local emergency services (911 in the [place]  A suicide crisis helpline, such as the National Suicide Prevention Lifeline at 951-218-6120. This is open 24 hours a day.  Summary  A panic attack is a sign of a serious health or mental health condition. Get help right away. Do not drive yourself to the hospital. Have someone else drive you or call an ambulance.  Always see a health care provider to have the reasons for the panic attack correctly diagnosed.  If your panic attack was caused by a physical problem, follow your health care provider's suggestions for medicine, referral to a specialist, and lifestyle changes.  If your panic attack was caused by an emotional problem, follow through with counseling from a qualified mental health specialist.  If you feel like you may hurt yourself or others, call 911 and get help right away. This information is not  intended to replace advice given to you by your health care provider. Make sure you discuss any questions you have with your health care provider. Document Released: 06/23/2005 Document Revised: 08/01/2016 Document Reviewed: 08/01/2016 Elsevier Interactive Patient Education  [employer]2018 [employer]

## 2018-01-11 NOTE — Assessment & Plan Note (Addendum)
Patient has had 2 episodes of chest pain the last 2 months and has a strong family history of heart disease in her brother and father who are both diabetic.  She does not have any additional risk factors other than her family history.  While her heart rate is 112 today, her EKG is reassuring.  Check CBC, BMP, TSH.  Discussed going to ER if she has another episode of chest pain, patient voiced good understanding.

## 2018-01-11 NOTE — Progress Notes (Addendum)
Subjective:  Joy Bautista is a 52 y.o. female who presents to the [facility] today with a chief complaint of chest pain.   HPI:  Had intermittent chest pain for the last 6 months that has been worsening recently.  The last time she had chest pain like this it ended up being a ruptured disc in her neck.  She has not had any neck symptoms recently.  However she has had 2 bad episodes of chest pain over the last 2 months. First was about 1.5 months ago.  She was sitting at rest when she had chest tightness radiating to her left arm and jaw pain.  This lasted for couple of days before self resolving. Her second episode was 3 weeks ago, also occurring at rest.  She had left-sided jaw pain, headache, dizziness associated with some shortness of breath and chest pain.  She felt like her heart was beating very fast.  This episode also self resolved. She did not seek medical attention for either of these episodes because she is afraid.  States her anxiety has been much worse lately her brother died unexpectedly in 05-Sep-2022 and then her father died in 11/04/2022 after being sick for about 2 years.  This is been very difficult on her and she feels like her mood has been out of control and has been worsening over the last few months.  She feels a sense of panic that was associated with both of the chest pain episodes described above.  Does not currently have any chest pain.  She does feel her heart beating fast.  She does not have any shortness of breath, abdominal pain, heartburn, indigestion, nausea, vomiting, lightheadedness, dizziness, jaw pain, arm pain today.   ROS: Per HPI  Objective:  Physical Exam: BP 134/78   Pulse (!) 112   Temp 98.6 F (37 C) (Oral)   Ht 5\' 5"  (1.651 m)   Wt 133 lb (60.3 kg)   LMP 03/22/2016   SpO2 99%   BMI 22.13 kg/m   Gen: NAD, resting comfortably CV: Tachycardic, normal S1-S2, no murmurs Pulm: NWOB, CTAB with no crackles, wheezes, or rhonchi GI: Normal bowel sounds  present. Soft, Nontender, Nondistended. MSK: no edema, cyanosis, or clubbing noted Skin: warm, dry Neuro: grossly normal, moves all extremities Psych: Normal affect and thought content  GAD 7 : Generalized Anxiety Score 01/11/2018 09/14/2017 09/29/2016 08/13/2016  Nervous, Anxious, on Edge 3 3 1 3   Control/stop worrying 2 1 1 2   Worry too much - different things 2 2 2 2   Trouble relaxing 2 2 0 2  Restless 2 2 1 2   Easily annoyed or irritable 2 2 1 3   Afraid - awful might happen 0 0 0 2  Total GAD 7 Score 13 12 6 16   Anxiety Difficulty Somewhat difficult - Somewhat difficult Somewhat difficult    Depression screen [facility] 2/9 01/11/2018 09/14/2017 10/21/2016  Decreased Interest 0 2 0  Down, Depressed, Hopeless 0 2 0  PHQ - 2 Score 0 4 0  Altered sleeping 0 0 -  Tired, decreased energy 1 2 -  Change in appetite 0 0 -  Feeling bad or failure about yourself  0 2 -  Trouble concentrating 1 2 -  Moving slowly or fidgety/restless 2 2 -  Suicidal thoughts 0 0 -  PHQ-9 Score 4 12 -  Difficult doing work/chores Somewhat difficult - -   EKG: sinus tachycardia without ST changes   Assessment/Plan:  Tachycardia Patient has had  2 episodes of chest pain the last 2 months and has a strong family history of heart disease in her brother and father who are both diabetic.  She does not have any additional risk factors other than her family history.  While her heart rate is 112 today, her EKG is reassuring.  Check CBC, BMP, TSH.  Discussed going to ER if she has another episode of chest pain, patient voiced good understanding.  Generalized anxiety disorder Patient history consistent with panic attacks in the setting of known anxiety disorder.  Neither her episodes have any known trigger however she has been under an increased amount of stress lately after the death of her brother and father.  She is doing well on Effexor.  Her GAD7 and PHQ 9 scores are stable.  Discussed [facility] for [facility].     Leland HerElsia J Deshia Vanderhoof, DO PGY-3, [facility] 01/11/2018 2:03 PM

## 2018-01-11 NOTE — Assessment & Plan Note (Signed)
Patient history consistent with panic attacks in the setting of known anxiety disorder.  Neither her episodes have any known trigger however she has been under an increased amount of stress lately after the death of her brother and father.  She is doing well on Effexor.  Her GAD7 and PHQ 9 scores are stable.  [facility].

## 2018-01-12 ENCOUNTER — Encounter: Payer: Self-pay | Admitting: Family Medicine

## 2018-01-12 LAB — BASIC METABOLIC PANEL
BUN / CREAT RATIO: 11 (ref 9–23)
BUN: 9 mg/dL (ref 6–24)
CALCIUM: 10.5 mg/dL — AB (ref 8.7–10.2)
CHLORIDE: 107 mmol/L — AB (ref 96–106)
CO2: 25 mmol/L (ref 20–29)
CREATININE: 0.82 mg/dL (ref 0.57–1.00)
GFR calc Af Amer: 94 mL/min/{1.73_m2} (ref >59)
GFR calc non Af Amer: 81 mL/min/{1.73_m2} (ref >59)
GLUCOSE: 135 mg/dL — AB (ref 65–99)
Potassium: 4.9 mmol/L (ref 3.5–5.2)
Sodium: 147 mmol/L — ABNORMAL HIGH (ref 134–144)

## 2018-01-12 LAB — CBC
HEMATOCRIT: 41.3 % (ref 34.0–46.6)
HEMOGLOBIN: 13.6 g/dL (ref 11.1–15.9)
MCH: 30.2 pg (ref 26.6–33.0)
MCHC: 32.9 g/dL (ref 31.5–35.7)
MCV: 92 fL (ref 79–97)
Platelets: 340 10*3/uL (ref 150–450)
RBC: 4.5 x10E6/uL (ref 3.77–5.28)
RDW: 13.1 % (ref 12.3–15.4)
WBC: 6.6 10*3/uL (ref 3.4–10.8)

## 2018-01-12 LAB — TSH: TSH: 1.57 u[IU]/mL (ref 0.450–4.500)

## 2018-04-05 ENCOUNTER — Other Ambulatory Visit: Payer: Self-pay | Admitting: Family Medicine

## 2018-04-09 ENCOUNTER — Other Ambulatory Visit: Payer: Self-pay

## 2018-04-09 ENCOUNTER — Ambulatory Visit: Payer: BLUE CROSS/BLUE SHIELD | Admitting: Family Medicine

## 2018-04-09 ENCOUNTER — Encounter: Payer: Self-pay | Admitting: Family Medicine

## 2018-04-09 VITALS — BP 140/82 | HR 114 | Temp 98.1°F | Wt 130.0 lb

## 2018-04-09 DIAGNOSIS — R739 Hyperglycemia, unspecified: Secondary | ICD-10-CM

## 2018-04-09 DIAGNOSIS — Z23 Encounter for immunization: Secondary | ICD-10-CM | POA: Diagnosis not present

## 2018-04-09 DIAGNOSIS — E119 Type 2 diabetes mellitus without complications: Secondary | ICD-10-CM

## 2018-04-09 DIAGNOSIS — E785 Hyperlipidemia, unspecified: Secondary | ICD-10-CM

## 2018-04-09 DIAGNOSIS — R0683 Snoring: Secondary | ICD-10-CM | POA: Diagnosis not present

## 2018-04-09 DIAGNOSIS — Z0000 Encounter for general adult medical examination without abnormal findings: Secondary | ICD-10-CM

## 2018-04-09 DIAGNOSIS — H9313 Tinnitus, bilateral: Secondary | ICD-10-CM

## 2018-04-09 LAB — POCT GLYCOSYLATED HEMOGLOBIN (HGB A1C): HBA1C, POC (CONTROLLED DIABETIC RANGE): 7.3 % — AB (ref 0.0–7.0)

## 2018-04-09 NOTE — Progress Notes (Signed)
Subjective:    Patient ID: Joy Bautista, female    DOB: 1966-05-06, 51 y.o.   MRN: [id number]   CC: ears ringing  HPI:  Tinnitus: Patient with intermittent tinnitus for years.  Reports that has been getting worse.  Her hearing has now started to worsen.  Patient does have history of hearing loss in her left ear and reports that tinnitus is worse in left ear.  Patient with no history of trauma and does not report waking up at night from ringing in her ears.  Loud Snoring: Patient divorced in 2004 and has not had anyone living with her since then.  Patient now with new partner who has noticed that she snores very loudly.  Her partner reports that she wakes up 3-4 times a night with no breathing.  Patient does report that she is often fatigued in the morning.  Patient reports that she had neck surgery back in 2013.  She has never had a sleep study.  Patient does have a strong family history of OSA with her father and brother having it.  Prediabetes: Patient's last A1c 5.8 in 2016. Has strong family history.  ROS: Denies polyuria, polydypsia  Health Maintenance: Lipid panel, due for mammogram Due for HIV and hep C screening Will get flu shot today  Decreased pleasure with sex: Denies any pain with intercourse, vaginal discharge or bleeding. Patient reports that she is able to be aroused, but does not have any pleasure. Asking if there is a "female viagra".  Smoking status reviewed  ROS: 10 point ROS is otherwise negative, except as mentioned in HPI  Patient Active Problem List   Diagnosis Date Noted  . Diabetes mellitus without complication (HCC) 04/13/2018  . Tinnitus, bilateral 04/13/2018  . Loud snoring 04/13/2018  . Hyperlipidemia 04/13/2018  . Tachycardia 01/11/2018  . Dizziness 08/15/2016  . Skin abnormality 08/09/2015  . Cervical cancer screening 01/25/2014  . Health care maintenance 01/10/2014  . Generalized anxiety disorder 07/10/2010  . DEPRESSION 07/10/2010  .  HEARING LOSS, LEFT EAR 07/10/2010  . ALLERGIC RHINITIS 07/10/2010  . MENOPAUSAL SYNDROME 07/10/2010  . Headache 07/10/2010  . RENAL CALCULUS, HX OF 07/10/2010     Objective:  BP 140/82   Pulse (!) 114   Temp 98.1 F (36.7 C) (Oral)   Wt 130 lb (59 kg)   LMP 03/22/2016   SpO2 99%   BMI 21.63 kg/m  Vitals and nursing note reviewed  General: NAD, pleasant Cardiac: RRR, normal heart sounds, no murmurs Respiratory: CTAB, normal effort Abdomen: soft, nontender, nondistended Extremities: no edema or cyanosis. WWP. Skin: warm and dry, no rashes noted Neuro: alert and oriented, no focal deficits Psych: normal affect  Assessment & Plan:    Diabetes mellitus without complication [facility]) Patient previously diagnosed with prediabetes.  On A1c during office visit patient now with diabetes with a hemoglobin A1c of 7.3.  Spoke with patient regarding diagnosis and she is very familiar with diabetes as her brother and father had, and she would like to attempt diet and lifestyle changes first prior to starting metformin.  Will schedule follow-up for patient to discuss diabetes management in 3 months.  Tinnitus, bilateral Patient with intermittent tinnitus for years.  Patient more annoyed by ringing in her ears.  Has not significantly changed her daily life.  She does report worsening hearing in her left ear.  No good medical treatment for tinnitus.  Patient currently not on any ototoxic medications.  Of note Effexor does have <2% tinnitus.  Loud snoring Will obtain a sleep study given reported apneic spells, family history and increase in blood pressure.   Hyperlipidemia Patient to try life-style modification for now rather than starting medication. ASCVD risk 6.6%, but now with T2DM. Will consider starting in 3 months at follow up.   Health Maintenance: HIV and Hep C neg Flu shot today Patient to schedule mammogram Continue to monitor BP.   [place] Wassim Kirksey, DO Family Medicine Resident  PGY-2

## 2018-04-09 NOTE — Patient Instructions (Signed)
Thank you for coming to see me today. It was a pleasure! Today we talked about:   Your ringing in your ears. There is no good treatment at this time. We could consider therapy, but we can do this later.   Your snoring. I have referred you for a sleep study and they will call to set this up for you.  We have gotten some lab work and will call you with your results.    Please follow-up with me in 3 months or sooner as needed.  If you have any questions or concerns, please do not hesitate to call the office at [phone].  Take Care,   [place] Adolpho Meenach, DO Tinnitus Tinnitus refers to hearing a sound when there is no actual source for that sound. This is often described as ringing in the ears. However, people with this condition may hear a variety of noises. A person may hear the sound in one ear or in both ears. The sounds of tinnitus can be soft, loud, or somewhere in between. Tinnitus can last for a few seconds or can be constant for days. It may go away without treatment and come back at various times. When tinnitus is constant or happens often, it can lead to other problems, such as trouble sleeping and trouble concentrating. Almost everyone experiences tinnitus at some point. Tinnitus that is long-lasting (chronic) or comes back often is a problem that may require medical attention. What are the causes? The cause of tinnitus is often not known. In some cases, it can result from other problems or conditions, including:  Exposure to loud noises from machinery, music, or other sources.  Hearing loss.  Ear or sinus infections.  Earwax buildup.  A foreign object in the ear.  Use of certain medicines.  Use of alcohol and caffeine.  High blood pressure.  Heart diseases.  Anemia.  Allergies.  Meniere disease.  Thyroid problems.  Tumors.  An enlarged part of a weakened blood vessel (aneurysm).  What are the signs or symptoms? The main symptom of tinnitus is hearing a  sound when there is no source for that sound. It may sound like:  Buzzing.  Roaring.  Ringing.  Blowing air, similar to the sound heard when you listen to a seashell.  Hissing.  Whistling.  Sizzling.  Humming.  Running water.  A sustained musical note.  How is this diagnosed? Tinnitus is diagnosed based on your symptoms. Your health care provider will do a physical exam. A comprehensive hearing exam (audiologic exam) will be done if your tinnitus:  Affects only one ear (unilateral).  Causes hearing difficulties.  Lasts 6 months or longer.  You may also need to see a health care provider who specializes in hearing disorders (audiologist). You may be asked to complete a questionnaire to determine the severity of your tinnitus. Tests may be done to help determine the cause and to rule out other conditions. These can include:  Imaging studies of your head and brain, such as: ? A CT scan. ? An MRI.  An imaging study of your blood vessels (angiogram).  How is this treated? Treating an underlying medical condition can sometimes make tinnitus go away. If your tinnitus continues, other treatments may include:  Medicines, such as certain antidepressants or sleeping aids.  Sound generators to mask the tinnitus. These include: ? Tabletop sound machines that play relaxing sounds to help you fall asleep. ? Wearable devices that fit in your ear and play sounds or music. ?  A small device that uses headphones to deliver a signal embedded in music (acoustic neural stimulation). In time, this may change the pathways of your brain and make you less sensitive to tinnitus. This device is used for very severe cases when no other treatment is working.  Therapy and counseling to help you manage the stress of living with tinnitus.  Using hearing aids or cochlear implants, if your tinnitus is related to hearing loss.  Follow these instructions at home:  When possible, avoid being in loud  places and being exposed to loud sounds.  Wear hearing protection, such as earplugs, when you are exposed to loud noises.  Do not take stimulants, such as nicotine, alcohol, or caffeine.  Practice techniques for reducing stress, such as meditation, yoga, or deep breathing.  Use a white noise machine, a humidifier, or other devices to mask the sound of tinnitus.  Sleep with your head slightly raised. This may reduce the impact of tinnitus.  Try to get plenty of rest each night. Contact a health care provider if:  You have tinnitus in just one ear.  Your tinnitus continues for 3 weeks or longer without stopping.  Home care measures are not helping.  You have tinnitus after a head injury.  You have tinnitus along with any of the following: ? Dizziness. ? Loss of balance. ? Nausea and vomiting. This information is not intended to replace advice given to you by your health care provider. Make sure you discuss any questions you have with your health care provider. Document Released: 06/23/2005 Document Revised: 02/24/2016 Document Reviewed: 11/23/2013 Elsevier Interactive Patient Education  2018 [employer].

## 2018-04-10 LAB — HIV ANTIBODY (ROUTINE TESTING W REFLEX): HIV Screen 4th Generation wRfx: NONREACTIVE

## 2018-04-10 LAB — LIPID PANEL
CHOL/HDL RATIO: 4.5 ratio — AB (ref 0.0–4.4)
Cholesterol, Total: 253 mg/dL — ABNORMAL HIGH (ref 100–199)
HDL: 56 mg/dL (ref >39)
LDL CALC: 159 mg/dL — AB (ref 0–99)
TRIGLYCERIDES: 192 mg/dL — AB (ref 0–149)
VLDL Cholesterol Cal: 38 mg/dL (ref 5–40)

## 2018-04-10 LAB — HEPATITIS C ANTIBODY

## 2018-04-13 ENCOUNTER — Telehealth: Payer: Self-pay

## 2018-04-13 DIAGNOSIS — R0683 Snoring: Secondary | ICD-10-CM | POA: Insufficient documentation

## 2018-04-13 DIAGNOSIS — H9313 Tinnitus, bilateral: Secondary | ICD-10-CM | POA: Insufficient documentation

## 2018-04-13 DIAGNOSIS — E119 Type 2 diabetes mellitus without complications: Secondary | ICD-10-CM | POA: Insufficient documentation

## 2018-04-13 DIAGNOSIS — E785 Hyperlipidemia, unspecified: Secondary | ICD-10-CM | POA: Insufficient documentation

## 2018-04-13 NOTE — Assessment & Plan Note (Signed)
Will obtain a sleep study given reported apneic spells, family history and increase in blood pressure.

## 2018-04-13 NOTE — Assessment & Plan Note (Addendum)
Patient previously diagnosed with prediabetes.  On A1c during office visit patient now with diabetes with a hemoglobin A1c of 7.3.  Spoke with patient regarding diagnosis and she is very familiar with diabetes as her brother and father had, and she would like to attempt diet and lifestyle changes first prior to starting metformin.  Will schedule follow-up for patient to discuss diabetes management in 3 months.

## 2018-04-13 NOTE — Telephone Encounter (Signed)
Pt LVM on nurse line stating she is returning a call to Dr. Talbert Forest. I didn't see any notes. Will forward to pcp.

## 2018-04-13 NOTE — Assessment & Plan Note (Signed)
Patient to try life-style modification for now rather than starting medication. ASCVD risk 6.6%, but now with T2DM. Will consider starting in 3 months at follow up.

## 2018-04-13 NOTE — Assessment & Plan Note (Signed)
Patient with intermittent tinnitus for years.  Patient more annoyed by ringing in her ears.  Has not significantly changed her daily life.  She does report worsening hearing in her left ear.  No good medical treatment for tinnitus.  Patient currently not on any ototoxic medications.  Of note Effexor does have <2% tinnitus.

## 2018-04-13 NOTE — Telephone Encounter (Signed)
Called and discussed lab results with patient including A1c. Patient reports recent spider bite on foot and given strict instructions on coming into the office for bite. Also instructed to return in 3 months to follow up on diabetes.

## 2018-05-24 ENCOUNTER — Encounter (HOSPITAL_COMMUNITY): Payer: Self-pay | Admitting: Emergency Medicine

## 2018-05-24 ENCOUNTER — Ambulatory Visit (HOSPITAL_COMMUNITY)
Admission: EM | Admit: 2018-05-24 | Discharge: 2018-05-24 | Disposition: A | Discharge status: Home / Self Care | Payer: BLUE CROSS/BLUE SHIELD | Attending: Family Medicine | Admitting: Family Medicine

## 2018-05-24 DIAGNOSIS — S39012A Strain of muscle, fascia and tendon of lower back, initial encounter: Secondary | ICD-10-CM | POA: Diagnosis not present

## 2018-05-24 DIAGNOSIS — R070 Pain in throat: Secondary | ICD-10-CM

## 2018-05-24 MED ORDER — METHOCARBAMOL 500 MG PO TABS: 500.0000 mg | ORAL_TABLET | Freq: Two times a day (BID) | ORAL | 0 refills | Status: DC

## 2018-05-24 MED ORDER — KETOROLAC TROMETHAMINE 30 MG/ML IJ SOLN
30.0000 mg | Freq: Once | INTRAMUSCULAR | Status: AC
  Administered 2018-05-24: 30 mg via INTRAMUSCULAR

## 2018-05-24 MED ORDER — NAPROXEN 500 MG PO TABS: 500.0000 mg | ORAL_TABLET | Freq: Two times a day (BID) | ORAL | 0 refills | Status: DC

## 2018-05-24 MED ORDER — KETOROLAC TROMETHAMINE 30 MG/ML IJ SOLN
INTRAMUSCULAR | Status: AC
  Filled 2018-05-24: qty 1

## 2018-05-24 MED ORDER — LIDOCAINE VISCOUS HCL 2 % MT SOLN: 15.0000 mL | OROMUCOSAL | 0 refills | Status: DC | PRN

## 2018-05-24 NOTE — ED Triage Notes (Signed)
Pt sts sore throat after eating very hot blueberry pie and pain in right hip after picking up dog on Friday

## 2018-05-24 NOTE — Discharge Instructions (Addendum)
We will give you some vicious lidocaine to gargle with to help with the throat pain. I believe your back pain is related to a lumbar strain Toradol injection given in clinic You can take Robaxin as needed for muscle relaxant Naproxen twice a  day with food as needed.  Follow up as needed for continued or worsening symptoms

## 2018-05-25 NOTE — ED Provider Notes (Signed)
[facility]    CSN: [id number] Arrival date & time: 05/24/18  1448     History   Chief Complaint Chief Complaint  Patient presents with  . Sore Throat  . Hip Pain    HPI Joy Bautista is a 52 y.o. female.   Patient is a 52 year old female who presents for right hip, lower lumbar pain is been constant since Friday.  Reports this initially started after having " rough sexual intercourse" on Friday evening.  She then exacerbated it more that following day when she went to pick up her dog and twisted her back again.  She has been taking Aleve with minimal relief of pain.  The pain is worse with twisting, bending.  She denies any radiation of pain, numbness, tingling, weakness.   She is also here with complaints of sore throat.  This started after eating some hot blueberry pie.  Reports throat feels raw and irritated and hurts to eat or drink anything.  She does not have any trouble swallowing, breathing.   ROS per HPI      Past Medical History:  Diagnosis Date  . Anxiety   . Cataract    bilateral  . Chest pain   . CTS (carpal tunnel syndrome)   . Diabetes mellitus without complication (HCC)   . Dizziness   . Gout   . SOB (shortness of breath)     Patient Active Problem List   Diagnosis Date Noted  . Diabetes mellitus without complication (HCC) 04/13/2018  . Tinnitus, bilateral 04/13/2018  . Loud snoring 04/13/2018  . Hyperlipidemia 04/13/2018  . Tachycardia 01/11/2018  . Dizziness 08/15/2016  . Skin abnormality 08/09/2015  . Cervical cancer screening 01/25/2014  . Health care maintenance 01/10/2014  . Generalized anxiety disorder 07/10/2010  . DEPRESSION 07/10/2010  . HEARING LOSS, LEFT EAR 07/10/2010  . ALLERGIC RHINITIS 07/10/2010  . MENOPAUSAL SYNDROME 07/10/2010  . Headache 07/10/2010  . RENAL CALCULUS, HX OF 07/10/2010    Past Surgical History:  Procedure Laterality Date  . CATARACT EXTRACTION    . Neck fusion C5-6    . TONSILECTOMY,  ADENOIDECTOMY, BILATERAL MYRINGOTOMY AND TUBES      OB History   None      Home Medications    Prior to Admission medications   Medication Sig Start Date End Date Taking? Authorizing Provider  ipratropium (ATROVENT) 0.06 % nasal spray Place 2 sprays into both nostrils 4 (four) times daily. 08/02/15   Hayden Rasmussen, NP  lidocaine (XYLOCAINE) 2 % solution Use as directed 15 mLs in the mouth or throat as needed for mouth pain. 05/24/18   Dahlia Byes A, NP  methocarbamol (ROBAXIN) 500 MG tablet Take 1 tablet (500 mg total) by mouth 2 (two) times daily. 05/24/18   Dahlia Byes A, NP  naproxen (NAPROSYN) 500 MG tablet Take 1 tablet (500 mg total) by mouth 2 (two) times daily as needed (Take with Food). 04/09/17   Multani, Bhupinder, NP  naproxen (NAPROSYN) 500 MG tablet Take 1 tablet (500 mg total) by mouth 2 (two) times daily. 05/24/18   Dahlia Byes A, NP  OVER THE COUNTER MEDICATION Cough syrup and decongestant.    [provider]  venlafaxine XR (EFFEXOR-XR) 150 MG 24 hr capsule TAKE 1 CAPSULE BY MOUTH DAILY WITH BREAKFAST 04/06/18   Shirley, [place], DO    Family History History reviewed. No pertinent family history.  Social History Social History   Tobacco Use  . Smoking status: Former Smoker  Types: Cigarettes    Last attempt to quit: 10/30/1990    Years since quitting: 27.5  . Smokeless tobacco: Never Used  Substance Use Topics  . Alcohol use: No  . Drug use: No     Allergies   Shellfish allergy and Sulfonamide derivatives   Review of Systems Review of Systems   Physical Exam Triage Vital Signs ED Triage Vitals [05/24/18 1601]  Enc Vitals Group     BP (!) 146/67     Pulse Rate 97     Resp 18     Temp 98.4 F (36.9 C)     Temp Source Oral     SpO2 100 %     Weight      Height      Head Circumference      Peak Flow      Pain Score 6     Pain Loc      Pain Edu?      Excl. in GC?    No data found.  Updated Vital Signs BP (!) 146/67 (BP Location:  Left Arm)   Pulse 97   Temp 98.4 F (36.9 C) (Oral)   Resp 18   LMP 03/22/2016   SpO2 100%   Visual Acuity Right Eye Distance:   Left Eye Distance:   Bilateral Distance:    Right Eye Near:   Left Eye Near:    Bilateral Near:     Physical Exam  Constitutional: She appears well-developed and well-nourished.  Non-toxic appearance. She does not appear ill.  HENT:  Head: Normocephalic and atraumatic.  Right Ear: Hearing normal.  Left Ear: Hearing normal.  Mouth/Throat: Uvula is midline and mucous membranes are normal. No oral lesions. No uvula swelling. Posterior oropharyngeal erythema present. No oropharyngeal exudate, posterior oropharyngeal edema or tonsillar abscesses. Tonsils are 0 on the right. Tonsils are 0 on the left. No tonsillar exudate.  Mild erythema to posterior oropharynx without ulcers or erosion.  Neck: Normal range of motion.  Pulmonary/Chest: Effort normal.  Musculoskeletal:  Mild tenderness to lower lumbar paravertebral musculature.  No bruising, swelling or deformities.  Neurological: She is alert.  Skin: Skin is warm and dry.  Nursing note and vitals reviewed.    UC Treatments / Results  Labs (all labs ordered are listed, but only abnormal results are displayed) Labs Reviewed - No data to display  EKG None  Radiology No results found.  Procedures Procedures (including critical care time)  Medications Ordered in UC Medications  ketorolac (TORADOL) 30 MG/ML injection 30 mg (30 mg Intramuscular Given 05/24/18 1627)    Initial Impression / Assessment and Plan / UC Course  I have reviewed the triage vital signs and the nursing notes.  Pertinent labs & imaging results that were available during my care of the patient were reviewed by me and considered in my medical decision making (see chart for details).     Most likely patient's pain is related to lumbar strain. Toradol injection given in clinic to treat She can take naproxen twice daily  with food for pain inflammation and Robaxin as needed for muscle relaxant Vicious lidocaine given to gargle and spit for throat discomfort Follow up as needed for continued or worsening symptoms  Final Clinical Impressions(s) / UC Diagnoses   Final diagnoses:  Strain of lumbar region, initial encounter  Throat pain     Discharge Instructions     We will give you some vicious lidocaine to gargle with to help with the throat  pain. I believe your back pain is related to a lumbar strain Toradol injection given in clinic You can take Robaxin as needed for muscle relaxant Naproxen twice a  day with food as needed.  Follow up as needed for continued or worsening symptoms      ED Prescriptions    Medication Sig Dispense Auth. Provider   lidocaine (XYLOCAINE) 2 % solution Use as directed 15 mLs in the mouth or throat as needed for mouth pain. 100 mL [place] Noyce A, NP   methocarbamol (ROBAXIN) 500 MG tablet Take 1 tablet (500 mg total) by mouth 2 (two) times daily. 20 tablet Amit Leece A, NP   naproxen (NAPROSYN) 500 MG tablet Take 1 tablet (500 mg total) by mouth 2 (two) times daily. 30 tablet Dahlia ByesBast, Saysha Menta A, NP     Controlled Substance Prescriptions [place] Controlled Substance Registry consulted? Not Applicable   Janace ArisBast, Kathey Simer A, NP 05/25/18 1605

## 2018-07-01 ENCOUNTER — Other Ambulatory Visit: Payer: Self-pay | Admitting: Family Medicine

## 2018-10-03 ENCOUNTER — Other Ambulatory Visit: Payer: Self-pay | Admitting: Family Medicine

## 2019-01-02 ENCOUNTER — Other Ambulatory Visit: Payer: Self-pay | Admitting: Family Medicine

## 2019-04-01 ENCOUNTER — Other Ambulatory Visit: Payer: Self-pay | Admitting: Family Medicine

## 2019-05-18 ENCOUNTER — Ambulatory Visit (INDEPENDENT_AMBULATORY_CARE_PROVIDER_SITE_OTHER): Payer: Self-pay | Admitting: Family Medicine

## 2019-05-18 ENCOUNTER — Encounter: Payer: Self-pay | Admitting: Family Medicine

## 2019-05-18 ENCOUNTER — Other Ambulatory Visit: Payer: Self-pay

## 2019-05-18 ENCOUNTER — Other Ambulatory Visit (HOSPITAL_COMMUNITY)
Admission: RE | Admit: 2019-05-18 | Discharge: 2019-05-18 | Disposition: A | Discharge status: Home / Self Care | Payer: No Typology Code available for payment source | Source: Ambulatory Visit | Attending: Family Medicine | Admitting: Family Medicine

## 2019-05-18 VITALS — BP 118/72 | HR 104 | Wt 134.6 lb

## 2019-05-18 DIAGNOSIS — Z124 Encounter for screening for malignant neoplasm of cervix: Secondary | ICD-10-CM | POA: Diagnosis present

## 2019-05-18 DIAGNOSIS — Z23 Encounter for immunization: Secondary | ICD-10-CM

## 2019-05-18 DIAGNOSIS — Z1231 Encounter for screening mammogram for malignant neoplasm of breast: Secondary | ICD-10-CM

## 2019-05-18 DIAGNOSIS — E119 Type 2 diabetes mellitus without complications: Secondary | ICD-10-CM

## 2019-05-18 DIAGNOSIS — F339 Major depressive disorder, recurrent, unspecified: Secondary | ICD-10-CM

## 2019-05-18 LAB — POCT GLYCOSYLATED HEMOGLOBIN (HGB A1C): HbA1c, POC (controlled diabetic range): 7.6 % — AB (ref 0.0–7.0)

## 2019-05-18 MED ORDER — BUPROPION HCL ER (SR) 150 MG PO TB12: ORAL_TABLET | ORAL | 2 refills | Status: DC

## 2019-05-18 NOTE — Progress Notes (Signed)
Date of Visit: 05/18/2019   HPI:  Patient presents today for a well woman exam.   Concerns today: She believes that her Effexor is no longer as effective.  She states that she believes that it is still helping her however she notices that her mood has been slightly down a little bit.  She states that she is still often sad about the recent passing of her father and brother however she is now coping well.  She is also doing much better and work and is much happier at her job.  She also has a new relationship with a boyfriend that is going quite well.  She is wondering if there is something else that we can do regarding her Effexor treatment.  Periods: Postmenopausal Pelvic symptoms:  Sexual activity: Yes, patient reports that she has noticed some vaginal dryness and occasional bleeding with foreplay, does not notice bleeding during sex STD Screening: Declined Pap smear status: We will perform today Exercise: Exercises regularly Diet: Has been eating more carbs but is trying to work on that Smoking: Never smoker Alcohol: Occasionally Drugs: None Advance directives: No Mood: As above  ROS: See HPI  PHYSICAL EXAM: BP 118/72   Pulse (!) 104   Wt 134 lb 9.6 oz (61.1 kg)   LMP 03/22/2016   SpO2 91%   BMI 22.40 kg/m  Gen: NAD, pleasant, cooperative HEENT: NCAT, PERRL, no palpable thyromegaly or anterior cervical lymphadenopathy Lungs: NWOB Abdomen: soft, nontender to palpation Neuro: grossly nonfocal, speech normal GU: normal appearing external genitalia without lesions. Vagina appears to have some atrophy and has no discharge. Cervix with some redness over the eyes and otherwise normal. No cervical motion tenderness or tenderness on bimanual exam. No adnexal masses.   ASSESSMENT/PLAN:  # Health maintenance:  -STD screening: declined -pap smear: performed today -mammogram: Order placed today -lipid screening: Performed today -immunizations: Flu shot today -advance directives:  n/a -handout given on health maintenance topics  FOLLOW UP: Follow up in 3 months in order to discuss starting therapy for diabetes.  Cervical cancer screening Pap smear performed today.  Patient states she thinks she may have had some abnormals in the past but when it was repeated it was always normal.  Depression, recurrent ([place]) Patient doing well on her Effexor but is noticing that it is not working quite as effectively as it previously has for her.  We discussed increasing the dose versus adding adjunct of therapy with Wellbutrin.  Patient would like to try adding Wellbutrin at this time.  If she has any significant side effects will discontinue and increase the Effexor dosage.  Patient to follow-up in 6 to 8 weeks regarding this new medication.  Diabetes mellitus without complication (HCC) I2L elevated today to 7.6 from 7.3 previously.  Patient again declines starting Metformin.  Counseled her on the benefits and risk versus not being on this medication.  However she again would like to try diet and exercise.  Will discuss at follow-up visit.  Martinique Camani Sesay, DO Family Medicine Resident PGY-3

## 2019-05-18 NOTE — Patient Instructions (Addendum)
Thank you for coming to see me today. It was a pleasure! Today we talked about:   Please have your mammogram done.  We will release your results of your Pap smear.  I have placed a referral for an eye doctor, it is important that you schedule an appointment.   I have attached some information on diet changes in order to help with your diabetes.  I will release your lab results on MyChart.  I will add Wellbutrin to the Effexor in order to see if this helps with your symptom control.  Please let me know how this is going and follow-up with me in January to let me know if it is helped with her symptoms or sooner if you have any side effects.  Please follow-up with me in 6 to 8 weeks or sooner as needed.  If you have any questions or concerns, please do not hesitate to call the office at [phone].  Take Care,   [place] Leianna Barga, DO   Diet Recommendations for Diabetes  Carbohydrate includes starch, sugar, and fiber.  Of these, only sugar and starch raise blood glucose.  (Fiber is found in fruits, vegetables [especially skin, seeds, and stalks] and whole grains.)   Starchy (carb) foods: Bread, rice, pasta, potatoes, corn, cereal, grits, crackers, bagels, muffins, all baked goods.  (Fruit, milk, and yogurt also have carbohydrate, but most of these foods will not spike your blood sugar as most starchy foods will.)  A few fruits do cause high blood sugars; use small portions of bananas (limit to 1/2 at a time), grapes, watermelon, oranges, and most tropical fruits.   Protein foods: Meat, fish, poultry, eggs, dairy foods, and beans such as pinto and kidney beans (beans also provide carbohydrate).   1. Eat at least REAL 3 meals and 1-2 snacks per day. Never go more than 4-5 hours while awake without eating. Eat breakfast within the first hour of getting up.   2. Limit starchy foods to TWO per meal and ONE per snack. ONE portion of a starchy  food is equal to the following:   - ONE slice of bread (or  its equivalent, such as half of a hamburger bun).   - 1/2 cup of a "scoopable" starchy food such as potatoes or rice.   - 15 grams of Total Carbohydrate as shown on food label.  3. Include at every meal: a protein food, a carb food, and vegetables and/or fruit.   - Obtain twice the volume of veg's as protein or carbohydrate foods for both lunch and dinner.   - Fresh or frozen veg's are best.   - Keep frozen veg's on hand for a quick vegetable serving.

## 2019-05-19 ENCOUNTER — Telehealth: Payer: Self-pay | Admitting: *Deleted

## 2019-05-19 LAB — LIPID PANEL
Chol/HDL Ratio: 4.4 ratio (ref 0.0–4.4)
Cholesterol, Total: 250 mg/dL — ABNORMAL HIGH (ref 100–199)
HDL: 57 mg/dL (ref >39)
LDL Chol Calc (NIH): 168 mg/dL — ABNORMAL HIGH (ref 0–99)
Triglycerides: 139 mg/dL (ref 0–149)
VLDL Cholesterol Cal: 25 mg/dL (ref 5–40)

## 2019-05-19 LAB — BASIC METABOLIC PANEL
BUN/Creatinine Ratio: 14 (ref 9–23)
BUN: 10 mg/dL (ref 6–24)
CO2: 24 mmol/L (ref 20–29)
Calcium: 9.9 mg/dL (ref 8.7–10.2)
Chloride: 106 mmol/L (ref 96–106)
Creatinine, Ser: 0.74 mg/dL (ref 0.57–1.00)
GFR calc Af Amer: 105 mL/min/{1.73_m2} (ref >59)
GFR calc non Af Amer: 91 mL/min/{1.73_m2} (ref >59)
Glucose: 110 mg/dL — ABNORMAL HIGH (ref 65–99)
Potassium: 3.7 mmol/L (ref 3.5–5.2)
Sodium: 146 mmol/L — ABNORMAL HIGH (ref 134–144)

## 2019-05-19 MED ORDER — ESTRADIOL 0.1 MG/GM VA CREA: TOPICAL_CREAM | VAGINAL | 12 refills | Status: DC

## 2019-05-19 NOTE — Telephone Encounter (Signed)
Pt informed.  Jessica Fleeger, CMA  

## 2019-05-19 NOTE — Telephone Encounter (Signed)
I have sent some in, apologize for the delay.

## 2019-05-19 NOTE — Telephone Encounter (Signed)
Pt states that MD was going to call in something for her vaginal atrophy, but nothing was called in. To PCP. Christen Bame, CMA

## 2019-05-20 LAB — CYTOLOGY - PAP
Comment: NEGATIVE
Diagnosis: NEGATIVE
Diagnosis: REACTIVE
High risk HPV: NEGATIVE

## 2019-05-20 NOTE — Assessment & Plan Note (Signed)
Pap smear performed today.  Patient states she thinks she may have had some abnormals in the past but when it was repeated it was always normal.

## 2019-05-20 NOTE — Assessment & Plan Note (Signed)
Patient doing well on her Effexor but is noticing that it is not working quite as effectively as it previously has for her.  We discussed increasing the dose versus adding adjunct of therapy with Wellbutrin.  Patient would like to try adding Wellbutrin at this time.  If she has any significant side effects will discontinue and increase the Effexor dosage.  Patient to follow-up in 6 to 8 weeks regarding this new medication.

## 2019-05-20 NOTE — Assessment & Plan Note (Addendum)
A1c elevated today to 7.6 from 7.3 previously.  Patient again declines starting Metformin. Also not interested in starting statin therapy. Counseled her on the benefits and risk versus not being on this medication.  However she again would like to try diet and exercise.  Will discuss at follow-up visit.

## 2019-06-09 ENCOUNTER — Other Ambulatory Visit: Payer: Self-pay | Admitting: Family Medicine

## 2019-06-15 ENCOUNTER — Telehealth: Payer: Self-pay

## 2019-06-15 NOTE — Telephone Encounter (Signed)
Patient calls nurse line stating she is ready to start Metformin. Patient stated this was discussed at her last visit, however she wanted to try diet and exercise. Patient doesn't like where her sugars have been. Average daily 150s-160s and this am 263. Please advise.

## 2019-06-16 ENCOUNTER — Encounter: Payer: Self-pay | Admitting: Family Medicine

## 2019-06-16 MED ORDER — METFORMIN HCL ER (OSM) 500 MG PO TB24: 500.0000 mg | ORAL_TABLET | Freq: Every day | ORAL | 1 refills | Status: DC

## 2019-06-16 NOTE — Telephone Encounter (Signed)
I have [facility] patient regarding starting metformin, and sent in a prescription.

## 2019-06-17 ENCOUNTER — Telehealth: Payer: Self-pay | Admitting: *Deleted

## 2019-06-17 ENCOUNTER — Telehealth: Payer: Self-pay | Admitting: Family Medicine

## 2019-06-17 MED ORDER — METFORMIN HCL ER (OSM) 500 MG PO TB24: 500.0000 mg | ORAL_TABLET | Freq: Every day | ORAL | 1 refills | Status: DC

## 2019-06-17 NOTE — Telephone Encounter (Signed)
Paged by pharamacist that metformin XL (fortamet) is no longer made and not carried at pharmacy.  Gave verbal order to change for metformin ER 500mg .  Will forward to PCP who ordered medication.  [place] Constable, D.O.  PGY-2 Family Medicine  06/17/2019 5:59 PM

## 2019-06-17 NOTE — Telephone Encounter (Signed)
Pt does not use [facility] anymore (insurance purposes).  Resent med to [facility] as requested.  Christen Bame, CMA

## 2019-06-21 ENCOUNTER — Encounter: Payer: Self-pay | Admitting: Family Medicine

## 2019-06-27 ENCOUNTER — Encounter: Payer: Self-pay | Admitting: Family Medicine

## 2019-07-03 ENCOUNTER — Other Ambulatory Visit: Payer: Self-pay | Admitting: Family Medicine

## 2019-07-06 ENCOUNTER — Encounter: Payer: Self-pay | Admitting: Family Medicine

## 2019-07-09 ENCOUNTER — Other Ambulatory Visit: Payer: Self-pay | Admitting: Family Medicine

## 2019-07-19 LAB — HM DIABETES EYE EXAM

## 2019-08-05 ENCOUNTER — Encounter: Payer: Self-pay | Admitting: Family Medicine

## 2019-08-08 ENCOUNTER — Other Ambulatory Visit: Payer: Self-pay

## 2019-08-08 ENCOUNTER — Encounter: Payer: Self-pay | Admitting: Family Medicine

## 2019-08-08 ENCOUNTER — Ambulatory Visit (INDEPENDENT_AMBULATORY_CARE_PROVIDER_SITE_OTHER): Payer: No Typology Code available for payment source | Admitting: Family Medicine

## 2019-08-08 VITALS — BP 102/60 | HR 109 | Wt 127.0 lb

## 2019-08-08 DIAGNOSIS — N200 Calculus of kidney: Secondary | ICD-10-CM

## 2019-08-08 DIAGNOSIS — M545 Low back pain, unspecified: Secondary | ICD-10-CM

## 2019-08-08 DIAGNOSIS — R1011 Right upper quadrant pain: Secondary | ICD-10-CM

## 2019-08-08 HISTORY — DX: Calculus of kidney: N20.0

## 2019-08-08 LAB — POCT URINALYSIS DIP (MANUAL ENTRY)
Bilirubin, UA: NEGATIVE
Glucose, UA: 250 mg/dL — AB
Ketones, POC UA: NEGATIVE mg/dL
Leukocytes, UA: NEGATIVE
Nitrite, UA: NEGATIVE
Protein Ur, POC: 30 mg/dL — AB
Spec Grav, UA: 1.02 (ref 1.010–1.025)
Urobilinogen, UA: 0.2 E.U./dL
pH, UA: 5.5 (ref 5.0–8.0)

## 2019-08-08 LAB — POCT UA - MICROSCOPIC ONLY

## 2019-08-08 NOTE — Addendum Note (Signed)
Addended by: Georges Lynch T on: 08/08/2019 01:58 PM   Modules accepted: Orders

## 2019-08-08 NOTE — Patient Instructions (Signed)
I will call with the lab test results tomorrow I will call with the CT scan results typically the day after the test. Because stones and infection are a bad combination, call me if fever.  If you catch the stone while straining your urine, bring it to use for lab tests.    Once I have the test results, we can talk about preventing stones in the future.

## 2019-08-08 NOTE — Assessment & Plan Note (Addendum)
Great hx for kidney stone.  I am starting from scratch about documenting type and severity.  Will give urine strainer and order future lab of kidney stone analysis.  Will also check CMP and uric acid.   Imaging:  Will check CT to R/O obstruction and to see if significant stone burden

## 2019-08-08 NOTE — Progress Notes (Signed)
   CHIEF COMPLAINT / HPI:  Likely kidney stone.  Patient has remote hx of kidney stones x 7.  Non recently.  5 days ago developed right-sided back pain that was typical of her previous kidney stones.  No fever or chills.  No GI symptoms.  Denies being at risk for STD Recent Dx of of DM.  Thin, but strong family hx of DM.   Knows to drink plenty of fluids. Has noticed what she felt was a small amount of blood in the urine.  Now feels lick stone has dropped into her bladder.  She does not think it has passed through her urethra.  PERTINENT  PMH / PSH: Searched chart.  No previous kidney stone imaging or analysis. She thinks previous kidney stones were calcium.  She has had a couple of episodes of "gout" recently.  I cannot find a previous uric acid.  I did find one lab showing borderline elevation of her calcium.   OBJECTIVE: BP 102/60   Pulse (!) 109   Wt 127 lb (57.6 kg)   LMP 03/22/2016   BMI 21.13 kg/m   Minimal right CVA discomfort to bimanual palpation.   Abd benign.    ASSESSMENT / PLAN:  Kidney stones Great hx for kidney stone.  I am starting from scratch about documenting type and severity.  Will give urine strainer and order future lab of kidney stone analysis.  Will also check CMP and uric acid.   Imaging:  Will check CT to R/O obstruction and to see if significant stone burden     Moses Manners, MD [facility]

## 2019-08-09 ENCOUNTER — Encounter: Payer: Self-pay | Admitting: Family Medicine

## 2019-08-09 LAB — CMP14+EGFR
ALT: 26 IU/L (ref 0–32)
AST: 20 IU/L (ref 0–40)
Albumin/Globulin Ratio: 1.8 (ref 1.2–2.2)
Albumin: 4.4 g/dL (ref 3.8–4.9)
Alkaline Phosphatase: 84 IU/L (ref 39–117)
BUN/Creatinine Ratio: 11 (ref 9–23)
BUN: 9 mg/dL (ref 6–24)
Bilirubin Total: 0.2 mg/dL (ref 0.0–1.2)
CO2: 23 mmol/L (ref 20–29)
Calcium: 9.8 mg/dL (ref 8.7–10.2)
Chloride: 109 mmol/L — ABNORMAL HIGH (ref 96–106)
Creatinine, Ser: 0.81 mg/dL (ref 0.57–1.00)
GFR calc Af Amer: 95 mL/min/{1.73_m2} (ref >59)
GFR calc non Af Amer: 82 mL/min/{1.73_m2} (ref >59)
Globulin, Total: 2.5 g/dL (ref 1.5–4.5)
Glucose: 257 mg/dL — ABNORMAL HIGH (ref 65–99)
Potassium: 4.5 mmol/L (ref 3.5–5.2)
Sodium: 147 mmol/L — ABNORMAL HIGH (ref 134–144)
Total Protein: 6.9 g/dL (ref 6.0–8.5)

## 2019-08-09 LAB — URIC ACID: Uric Acid: 4.1 mg/dL (ref 3.0–7.2)

## 2019-08-10 ENCOUNTER — Telehealth: Payer: Self-pay | Admitting: Family Medicine

## 2019-08-10 ENCOUNTER — Encounter: Payer: Self-pay | Admitting: Family Medicine

## 2019-08-10 NOTE — Telephone Encounter (Signed)
[facility] called and said they had to cancel this patients appointment due to not having an authorization. They said they can reschedule once they get this authorization.

## 2019-08-10 NOTE — Telephone Encounter (Signed)
Will forward to referral coordinator who does our imaging PA.  Nyleah Mcginnis,CMA

## 2019-08-11 ENCOUNTER — Other Ambulatory Visit: Payer: No Typology Code available for payment source

## 2019-08-11 NOTE — Addendum Note (Signed)
Addended by: Anselmo Rod on: 08/11/2019 03:09 PM   Modules accepted: Orders

## 2019-08-18 ENCOUNTER — Encounter: Payer: Self-pay | Admitting: Family Medicine

## 2019-08-18 LAB — CALCULI, WITH PHOTOGRAPH (CLINICAL LAB)
Calcium Oxalate Dihydrate: 70 %
Calcium Oxalate Monohydrate: 20 %
Hydroxyapatite: 10 %
Weight Calculi: 18 mg

## 2019-09-28 ENCOUNTER — Other Ambulatory Visit: Payer: Self-pay | Admitting: Family Medicine

## 2019-10-04 ENCOUNTER — Other Ambulatory Visit: Payer: Self-pay | Admitting: *Deleted

## 2019-10-04 MED ORDER — VENLAFAXINE HCL ER 150 MG PO CP24: ORAL_CAPSULE | ORAL | 0 refills | Status: DC

## 2019-11-07 ENCOUNTER — Encounter: Payer: Self-pay | Admitting: Family Medicine

## 2019-12-13 ENCOUNTER — Encounter: Payer: Self-pay | Admitting: Family Medicine

## 2019-12-25 ENCOUNTER — Other Ambulatory Visit: Payer: Self-pay | Admitting: Family Medicine

## 2019-12-29 ENCOUNTER — Other Ambulatory Visit: Payer: Self-pay | Admitting: Family Medicine

## 2020-01-31 ENCOUNTER — Emergency Department (HOSPITAL_COMMUNITY)
Admission: EM | Admit: 2020-01-31 | Discharge: 2020-02-01 | Disposition: A | Discharge status: Home / Self Care | Payer: No Typology Code available for payment source | Attending: Emergency Medicine | Admitting: Emergency Medicine

## 2020-01-31 ENCOUNTER — Other Ambulatory Visit: Payer: Self-pay

## 2020-01-31 ENCOUNTER — Emergency Department (HOSPITAL_COMMUNITY): Payer: No Typology Code available for payment source

## 2020-01-31 ENCOUNTER — Encounter (HOSPITAL_COMMUNITY): Payer: Self-pay | Admitting: Emergency Medicine

## 2020-01-31 DIAGNOSIS — N12 Tubulo-interstitial nephritis, not specified as acute or chronic: Secondary | ICD-10-CM | POA: Insufficient documentation

## 2020-01-31 DIAGNOSIS — R112 Nausea with vomiting, unspecified: Secondary | ICD-10-CM | POA: Insufficient documentation

## 2020-01-31 DIAGNOSIS — E119 Type 2 diabetes mellitus without complications: Secondary | ICD-10-CM | POA: Insufficient documentation

## 2020-01-31 DIAGNOSIS — Z7984 Long term (current) use of oral hypoglycemic drugs: Secondary | ICD-10-CM | POA: Insufficient documentation

## 2020-01-31 DIAGNOSIS — Z8541 Personal history of malignant neoplasm of cervix uteri: Secondary | ICD-10-CM | POA: Insufficient documentation

## 2020-01-31 DIAGNOSIS — Z87891 Personal history of nicotine dependence: Secondary | ICD-10-CM | POA: Insufficient documentation

## 2020-01-31 DIAGNOSIS — N200 Calculus of kidney: Secondary | ICD-10-CM

## 2020-01-31 LAB — COMPREHENSIVE METABOLIC PANEL
ALT: 18 U/L (ref 0–44)
AST: 34 U/L (ref 15–41)
Albumin: 4.2 g/dL (ref 3.5–5.0)
Alkaline Phosphatase: 62 U/L (ref 38–126)
Anion gap: 12 (ref 5–15)
BUN: 11 mg/dL (ref 6–20)
CO2: 21 mmol/L — ABNORMAL LOW (ref 22–32)
Calcium: 9.2 mg/dL (ref 8.9–10.3)
Chloride: 110 mmol/L (ref 98–111)
Creatinine, Ser: 0.8 mg/dL (ref 0.44–1.00)
GFR calc Af Amer: >60 mL/min (ref >60)
GFR calc non Af Amer: >60 mL/min (ref >60)
Glucose, Bld: 208 mg/dL — ABNORMAL HIGH (ref 70–99)
Potassium: 4.5 mmol/L (ref 3.5–5.1)
Sodium: 143 mmol/L (ref 135–145)
Total Bilirubin: 0.9 mg/dL (ref 0.3–1.2)
Total Protein: 7 g/dL (ref 6.5–8.1)

## 2020-01-31 LAB — LIPASE, BLOOD: Lipase: 108 U/L — ABNORMAL HIGH (ref 11–51)

## 2020-01-31 MED ORDER — ONDANSETRON HCL 4 MG/2ML IJ SOLN
4.0000 mg | Freq: Once | INTRAMUSCULAR | Status: AC
  Administered 2020-01-31: 4 mg via INTRAVENOUS
  Filled 2020-01-31: qty 2

## 2020-01-31 MED ORDER — SODIUM CHLORIDE 0.9% FLUSH
3.0000 mL | Freq: Once | INTRAVENOUS | Status: AC
  Administered 2020-01-31: 3 mL via INTRAVENOUS

## 2020-01-31 MED ORDER — KETOROLAC TROMETHAMINE 30 MG/ML IJ SOLN
30.0000 mg | Freq: Once | INTRAMUSCULAR | Status: AC
  Administered 2020-01-31: 30 mg via INTRAVENOUS
  Filled 2020-01-31: qty 1

## 2020-01-31 NOTE — ED Triage Notes (Signed)
Patient is complaining of left lower abdominal pain and left flank pain x 1 day. Patient has a long hx of kidney stones. PiV # 18 Left AC. 100 mcg of fentanyl and 11.4 mg of Ketamine.

## 2020-01-31 NOTE — ED Provider Notes (Signed)
[facility]-EMERGENCY DEPT Provider Note   CSN: [id number] Arrival date & time: 01/31/20  2112     History Chief Complaint  Patient presents with  . Abdominal Pain  . Flank Pain    Joy Bautista is a 54 y.o. female.  The history is provided by the patient. No language interpreter was used.  Abdominal Pain Flank Pain Associated symptoms include abdominal pain.   54 year old female with significant history of kidney stones, diabetes, anxiety, presenting for evaluation of flank pain.  Patient report approximately 3 hours ago while sitting and she developed acute onset of pain to her left flank radiates towards her left lower abdomen.  Pain is sharp, shooting, very intense, with associated nausea, and she vomited once.  When urinating she noticed some tinge of blood.  She states the symptoms felt very similar to prior kidney stone that she had in the past.  Nothing seems to make the pain better or worse.  EMS was contacted and patient was given pain medication prior to arrival.  It did help with the pain but she felt that her pain is slowly coming back.  She does not complain of any fever or chills no chest pain shortness of breath no dysuria.  She denies any heavy lifting or strength activities but did report she is starting a new job working for [employer].  She mention she used to have recurrent kidney stone as a child which went away for approximately 30 years.  She developed kidney stone in January of this year, but this episode appears to be more intense.    Past Medical History:  Diagnosis Date  . Anxiety   . Cataract    bilateral  . Chest pain   . CTS (carpal tunnel syndrome)   . Diabetes mellitus without complication (HCC)   . Dizziness   . Gout   . SOB (shortness of breath)     Patient Active Problem List   Diagnosis Date Noted  . Calcium oxalate kidney stones 08/08/2019  . Diabetes mellitus without complication (HCC) 04/13/2018  . Tinnitus,  bilateral 04/13/2018  . Loud snoring 04/13/2018  . Hyperlipidemia 04/13/2018  . Cervical cancer screening 01/25/2014  . Generalized anxiety disorder 07/10/2010  . Depression, recurrent (HCC) 07/10/2010  . HEARING LOSS, LEFT EAR 07/10/2010  . ALLERGIC RHINITIS 07/10/2010  . MENOPAUSAL SYNDROME 07/10/2010    Past Surgical History:  Procedure Laterality Date  . CATARACT EXTRACTION    . Neck fusion C5-6    . TONSILECTOMY, ADENOIDECTOMY, BILATERAL MYRINGOTOMY AND TUBES       OB History   No obstetric history on file.     History reviewed. No pertinent family history.  Social History   Tobacco Use  . Smoking status: Former Smoker    Types: Cigarettes    Quit date: 10/30/1990    Years since quitting: 29.2  . Smokeless tobacco: Never Used  Substance Use Topics  . Alcohol use: No  . Drug use: No    Home Medications Prior to Admission medications   Medication Sig Start Date End Date Taking? Authorizing Provider  buPROPion (WELLBUTRIN SR) 150 MG 12 hr tablet TAKE ONE TABLET ONCE DAILY FOR THE FIRST 3 DAYS AND MAY INCREASE TO TWICE DAILY IF TOLERATED 06/09/19   Shirley, [place], DO  estradiol (ESTRACE VAGINAL) 0.1 MG/GM vaginal cream Insert 2 g daily intravaginally for 1 week.  Then use 2g, 2-3 times per week. 05/19/19   Shirley, [place], DO  metFORMIN (GLUCOPHAGE-XR) 500 MG  24 hr tablet TAKE 1 TABLET (500 MG TOTAL) BY MOUTH DAILY WITH SUPPER. 12/26/19   Talbert ForestShirley, [place], DO  OVER THE COUNTER MEDICATION Cough syrup and decongestant.    [provider]  venlafaxine XR (EFFEXOR-XR) 150 MG 24 hr capsule TAKE 1 CAPSULE BY MOUTH EVERY MORNING WITH BREAKFAST 12/29/19   Shirley, [place], DO  buPROPion [facility](WELLBUTRIN SR) 150 MG 12 hr tablet Use 150 mg once daily for the first 3 days and may increase to twice daily if tolerated 05/18/19   Talbert ForestShirley, [place], DO  venlafaxine XR (EFFEXOR-XR) 150 MG 24 hr capsule TAKE 1 CAPSULE BY MOUTH EVERY MORNING WITH BREAKFAST 10/04/18   Talbert ForestShirley, [place], DO    venlafaxine XR (EFFEXOR-XR) 150 MG 24 hr capsule TAKE 1 CAPSULE BY MOUTH EVERY MORNING WITH BREAKFAST 01/03/19   Shirley, [place], DO    Allergies    Shellfish allergy and Sulfonamide derivatives  Review of Systems   Review of Systems  Gastrointestinal: Positive for abdominal pain.  Genitourinary: Positive for flank pain.  All other systems reviewed and are negative.   Physical Exam Updated Vital Signs BP (!) 147/85   Pulse 88   Temp 98.1 F (36.7 C) (Oral)   Resp 18   Ht 5\' 5"  (1.651 m)   Wt 55.3 kg   LMP 03/22/2016   SpO2 99%   BMI 20.30 kg/m   Physical Exam Vitals and nursing note reviewed.  Constitutional:      General: She is not in acute distress.    Appearance: She is well-developed.  HENT:     Head: Atraumatic.  Eyes:     Conjunctiva/sclera: Conjunctivae normal.  Cardiovascular:     Rate and Rhythm: Normal rate and regular rhythm.     Heart sounds: Normal heart sounds.  Pulmonary:     Effort: Pulmonary effort is normal.     Breath sounds: Normal breath sounds.  Abdominal:     General: Abdomen is flat.     Palpations: Abdomen is soft.     Tenderness: There is no abdominal tenderness. There is left CVA tenderness. There is no right CVA tenderness.     Hernia: No hernia is present.  Musculoskeletal:     Cervical back: Neck supple.  Skin:    Findings: No rash.  Neurological:     Mental Status: She is alert.     ED Results / Procedures / Treatments   Labs (all labs ordered are listed, but only abnormal results are displayed) Labs Reviewed  LIPASE, BLOOD - Abnormal; Notable for the following components:      Result Value   Lipase 108 (*)    All other components within normal limits  COMPREHENSIVE METABOLIC PANEL - Abnormal; Notable for the following components:   CO2 21 (*)    Glucose, Bld 208 (*)    All other components within normal limits  URINALYSIS, ROUTINE W REFLEX MICROSCOPIC - Abnormal; Notable for the following components:   Hgb urine  dipstick LARGE (*)    Ketones, ur 40 (*)    RBC / HPF >50 (*)    Bacteria, UA RARE (*)    All other components within normal limits  CBC    EKG None  Radiology CT Renal Stone Study  Result Date: 01/31/2020 CLINICAL DATA:  Left-sided flank pain for 1 day EXAM: CT ABDOMEN AND PELVIS WITHOUT CONTRAST TECHNIQUE: Multidetector CT imaging of the abdomen and pelvis was performed following the standard protocol without IV contrast. COMPARISON:  None. FINDINGS: Lower chest: No  acute abnormality. Hepatobiliary: No focal liver abnormality is seen. No gallstones, gallbladder wall thickening, or biliary dilatation. Pancreas: Unremarkable. No pancreatic ductal dilatation or surrounding inflammatory changes. Spleen: Normal in size without focal abnormality. Adrenals/Urinary Tract: Adrenal glands are within normal limits. Right kidney is well visualized without focal abnormality. Left kidney demonstrates evidence of mild perinephric stranding and mild hydronephrosis extending to the left mid ureter where a 3 mm obstructing stone is seen. This is best visualized on image number 40 of series 2 and image number 69 of series 4. The more distal left ureter is within normal limits. Bladder is partially distended. Stomach/Bowel: Colon shows no obstructive or inflammatory changes. Appendix is not visualized. No inflammatory changes to suggest appendicitis are seen. Small bowel and stomach appear within normal limits. Vascular/Lymphatic: Aortic atherosclerosis. No enlarged abdominal or pelvic lymph nodes. Reproductive: Uterus and bilateral adnexa are unremarkable. Other: No abdominal wall hernia or abnormality. No abdominopelvic ascites. Musculoskeletal: No acute or significant osseous findings. IMPRESSION: 3 mm partially obstructing stone left mid ureter with subsequent hydronephrosis. No other focal abnormality is noted. Electronically Signed   By: Alcide Clever M.D.   On: 01/31/2020 22:36    Procedures Procedures  (including critical care time)  Medications Ordered in ED Medications  sodium chloride flush (NS) 0.9 % injection 3 mL (3 mLs Intravenous Given 01/31/20 2309)  ondansetron (ZOFRAN) injection 4 mg (4 mg Intravenous Given 01/31/20 2309)  ketorolac (TORADOL) 30 MG/ML injection 30 mg (30 mg Intravenous Given 01/31/20 2309)  morphine 4 MG/ML injection 4 mg (4 mg Intravenous Given 02/01/20 0133)  ondansetron (ZOFRAN) injection 4 mg (4 mg Intravenous Given 02/01/20 0132)    ED Course  I have reviewed the triage vital signs and the nursing notes.  Pertinent labs & imaging results that were available during my care of the patient were reviewed by me and considered in my medical decision making (see chart for details).    MDM Rules/Calculators/A&P                          BP (!) 125/62 (BP Location: Left Arm)   Pulse 99   Temp 98.3 F (36.8 C) (Oral)   Resp 17   Ht 5\' 5"  (1.651 m)   Wt 55.3 kg   LMP 03/22/2016   SpO2 99%   BMI 20.30 kg/m   Final Clinical Impression(s) / ED Diagnoses Final diagnoses:  Kidney stone    Rx / DC Orders ED Discharge Orders         Ordered    HYDROcodone-acetaminophen (NORCO/VICODIN) 5-325 MG tablet  Every 6 hours PRN     Discontinue  Reprint     02/01/20 0242    tamsulosin (FLOMAX) 0.4 MG CAPS capsule  Daily     Discontinue  Reprint     02/01/20 0242    ondansetron (ZOFRAN) 4 MG tablet  Every 8 hours PRN     Discontinue  Reprint     02/01/20 0242         10:46 PM Patient here with reproducible left flank pain, history of kidney stones.  Symptom suggestive of complication from a kidney stone.  Work-up initiated, pain medication and antinausea medication provided.  A CT renal stone study was obtained demonstrating a 3 mm partially obstructive stone in the left mid ureter with subsequent hydronephrosis.  This finding is consistent with patient's presenting complaint.  1:18 AM Pain is returned, patient endorsed increased nausea, will provide additional  pain medication and antinausea medication.  UA is currently pending.  2:43 AM UA without signs of urinary tract infection.  Mild elevated lipase of 108 however likely related to pancreatitis.  Patient was discharged home with pain medication, antinausea medication, as well as Flomax.  Urology referral given.  Return precaution discussed.   Fayrene Helper, PA-C 02/01/20 0244    Charlynne Pander, MD 02/01/20 1455

## 2020-02-01 LAB — URINALYSIS, ROUTINE W REFLEX MICROSCOPIC
Bilirubin Urine: NEGATIVE
Glucose, UA: NEGATIVE mg/dL
Ketones, ur: 40 mg/dL — AB
Leukocytes,Ua: NEGATIVE
Nitrite: NEGATIVE
Protein, ur: NEGATIVE mg/dL
RBC / HPF: >50 RBC/hpf — ABNORMAL HIGH (ref 0–5)
Specific Gravity, Urine: 1.01 (ref 1.005–1.030)
pH: 6 (ref 5.0–8.0)

## 2020-02-01 MED ORDER — TAMSULOSIN HCL 0.4 MG PO CAPS: 0.4000 mg | ORAL_CAPSULE | Freq: Every day | ORAL | 0 refills | Status: DC

## 2020-02-01 MED ORDER — ONDANSETRON HCL 4 MG/2ML IJ SOLN
4.0000 mg | Freq: Once | INTRAMUSCULAR | Status: AC
  Administered 2020-02-01: 4 mg via INTRAVENOUS
  Filled 2020-02-01: qty 2

## 2020-02-01 MED ORDER — HYDROCODONE-ACETAMINOPHEN 5-325 MG PO TABS: 1.0000 | ORAL_TABLET | Freq: Four times a day (QID) | ORAL | 0 refills | Status: DC | PRN

## 2020-02-01 MED ORDER — ONDANSETRON HCL 4 MG PO TABS
4.0000 mg | ORAL_TABLET | Freq: Three times a day (TID) | ORAL | 0 refills | Status: DC | PRN
Start: 2020-02-01 — End: 2020-05-25

## 2020-02-01 MED ORDER — MORPHINE SULFATE (PF) 4 MG/ML IV SOLN
4.0000 mg | Freq: Once | INTRAVENOUS | Status: AC
  Administered 2020-02-01: 4 mg via INTRAVENOUS
  Filled 2020-02-01: qty 1

## 2020-02-01 NOTE — Discharge Instructions (Signed)
You have been diagnosed with a 3 mm kidney stone on the left side.  Take medication prescribed.  You would likely pass the stone in the next few days.  Use the urine strainer and capture the stone if possible and bring it to the urologist for further analysis.

## 2020-02-07 ENCOUNTER — Other Ambulatory Visit: Payer: Self-pay | Admitting: *Deleted

## 2020-02-07 ENCOUNTER — Encounter: Payer: Self-pay | Admitting: Family Medicine

## 2020-02-07 MED ORDER — VENLAFAXINE HCL ER 150 MG PO CP24: ORAL_CAPSULE | ORAL | 0 refills | Status: DC

## 2020-02-15 ENCOUNTER — Other Ambulatory Visit: Payer: Self-pay | Admitting: Family Medicine

## 2020-02-15 ENCOUNTER — Telehealth: Payer: Self-pay | Admitting: Family Medicine

## 2020-02-15 DIAGNOSIS — N200 Calculus of kidney: Secondary | ICD-10-CM

## 2020-02-15 MED ORDER — TAMSULOSIN HCL 0.4 MG PO CAPS: 0.4000 mg | ORAL_CAPSULE | Freq: Every day | ORAL | 0 refills | Status: AC

## 2020-02-15 MED ORDER — TAMSULOSIN HCL 0.4 MG PO CAPS: 0.4000 mg | ORAL_CAPSULE | Freq: Every day | ORAL | 0 refills | Status: DC

## 2020-02-15 NOTE — Telephone Encounter (Signed)
Attempted to call Ms. Toth regarding her continued urinary issues, for which she has messaged in [place]. I would like to know if over-the-counter tylenol and ibprofen are helping her. At this time, I will not be prescribing any more norco until she is seen again.   [name], MD

## 2020-02-15 NOTE — Progress Notes (Signed)
I have received a MyChart message from Joy Bautista regarding ongoing urinary issues with her kidney stones. I attempted to call her; no answer but left message. I would like to know if over-the-counter ibuprofen and tylenol are helping her.  I have reviewed her PDMP report. At this time, I will not be refilling the norco until she is seen again. If she truly is having hematuria and continued acute pain, she needs to be seen in our clinic. She may take 600mg  ibuprofen every 6 hours as needed for pain for 5 to 7 days. I will also send in a one-time script for flomax 0.4mg  daily x 7 days, as this may help wither ureter spasm and pain from passing kidney stones.   Of note, her recent creatinine levels are normal (0.74, 0.81, 0.80) and she has no history of GI bleed. Her chart mentions an allergy to sulfa derivatives of unknown severity, but she was prescribed a 7-day course of flomax in late July and was without issue.   August, MD

## 2020-02-15 NOTE — Addendum Note (Signed)
Addended by: Valetta Close on: 02/15/2020 11:02 AM   Modules accepted: Orders

## 2020-05-06 ENCOUNTER — Other Ambulatory Visit: Payer: Self-pay | Admitting: Family Medicine

## 2020-05-08 ENCOUNTER — Encounter: Payer: Self-pay | Admitting: Family Medicine

## 2020-05-08 ENCOUNTER — Other Ambulatory Visit: Payer: Self-pay

## 2020-05-08 ENCOUNTER — Ambulatory Visit (INDEPENDENT_AMBULATORY_CARE_PROVIDER_SITE_OTHER): Payer: Commercial Managed Care - PPO | Admitting: Family Medicine

## 2020-05-08 VITALS — BP 128/72 | HR 82 | Ht 65.0 in | Wt 122.2 lb

## 2020-05-08 DIAGNOSIS — E119 Type 2 diabetes mellitus without complications: Secondary | ICD-10-CM

## 2020-05-08 DIAGNOSIS — Z23 Encounter for immunization: Secondary | ICD-10-CM

## 2020-05-08 DIAGNOSIS — F411 Generalized anxiety disorder: Secondary | ICD-10-CM

## 2020-05-08 LAB — POCT GLYCOSYLATED HEMOGLOBIN (HGB A1C): Hemoglobin A1C: 6.9 % — AB (ref 4.0–5.6)

## 2020-05-08 MED ORDER — VENLAFAXINE HCL ER 75 MG PO CP24: ORAL_CAPSULE | ORAL | 0 refills | Status: DC

## 2020-05-08 MED ORDER — BUSPIRONE HCL 5 MG PO TABS: 5.0000 mg | ORAL_TABLET | Freq: Two times a day (BID) | ORAL | 0 refills | Status: DC

## 2020-05-08 NOTE — Assessment & Plan Note (Signed)
Hemoglobin A1c 6.9 from 7.6 at previous check.  Patient currently taking Metformin which she reports good compliance with. -Continue Metformin as prescribed -Follow-up in 3 months for repeat hemoglobin A1c

## 2020-05-08 NOTE — Patient Instructions (Signed)
It was a pleasure to meet you today.  Regarding your diabetes your hemoglobin A1c was 6.9 from 7.6 at your previous check.  I would like for you to continue your current blood sugar management but please do not check your blood sugars all the time.  Regarding your anxiety I think the goal needs to be to switch you to another medication that is better for anxiety.  I would like for you to stop taking the Wellbutrin completely and I am sending a new prescription for a lower dose of the venlafaxine which she will take for approximately 2 weeks.  You have an appointment scheduled with your primary care provider in roughly 2 weeks where she will most likely discontinue that medication and start you on a new medication.  I am also going to start you on buspirone 5 mg twice a day.  If you have any issues, questions, concerns please feel free to call our clinic or send me a MyChart message.  If you would like first [place] to reach out to our therapist we can have them call you but please let us know.  I hope you have a wonderful afternoon!  Buspirone tablets What is this medicine? BUSPIRONE (byoo SPYE rone) is used to treat anxiety disorders. This medicine may be used for other purposes; ask your health care provider or pharmacist if you have questions. COMMON BRAND NAME(S): BuSpar What should I tell my health care provider before I take this medicine? They need to know if you have any of these conditions:  kidney or liver disease  an unusual or allergic reaction to buspirone, other medicines, foods, dyes, or preservatives  pregnant or trying to get pregnant  breast-feeding How should I use this medicine? Take this medicine by mouth with a glass of water. Follow the directions on the prescription label. You may take this medicine with or without food. To ensure that this medicine always works the same way for you, you should take it either always with or always without food. Take your doses at regular  intervals. Do not take your medicine more often than directed. Do not stop taking except on the advice of your doctor or health care professional. Talk to your pediatrician regarding the use of this medicine in children. Special care may be needed. Overdosage: If you think you have taken too much of this medicine contact a poison control center or emergency room at once. NOTE: This medicine is only for you. Do not share this medicine with others. What if I miss a dose? If you miss a dose, take it as soon as you can. If it is almost time for your next dose, take only that dose. Do not take double or extra doses. What may interact with this medicine? Do not take this medicine with any of the following medications:  linezolid  MAOIs like Carbex, Eldepryl, Marplan, Nardil, and Parnate  methylene blue  procarbazine This medicine may also interact with the following medications:  diazepam  digoxin  diltiazem  erythromycin  grapefruit juice  haloperidol  medicines for mental depression or mood problems  medicines for seizures like carbamazepine, phenobarbital and phenytoin  nefazodone  other medications for anxiety  rifampin  ritonavir  some antifungal medicines like itraconazole, ketoconazole, and voriconazole  verapamil  warfarin This list may not describe all possible interactions. Give your health care provider a list of all the medicines, herbs, non-prescription drugs, or dietary supplements you use. Also tell them if you smoke, drink  alcohol, or use illegal drugs. Some items may interact with your medicine. What should I watch for while using this medicine? Visit your doctor or health care professional for regular checks on your progress. It may take 1 to 2 weeks before your anxiety gets better. You may get drowsy or dizzy. Do not drive, use machinery, or do anything that needs mental alertness until you know how this drug affects you. Do not stand or sit up quickly,  especially if you are an older patient. This reduces the risk of dizzy or fainting spells. Alcohol can make you more drowsy and dizzy. Avoid alcoholic drinks. What side effects may I notice from receiving this medicine? Side effects that you should report to your doctor or health care professional as soon as possible:  blurred vision or other vision changes  chest pain  confusion  difficulty breathing  feelings of hostility or anger  muscle aches and pains  numbness or tingling in hands or feet  ringing in the ears  skin rash and itching  vomiting  weakness Side effects that usually do not require medical attention (report to your doctor or health care professional if they continue or are bothersome):  disturbed dreams, nightmares  headache  nausea  restlessness or nervousness  sore throat and nasal congestion  stomach upset This list may not describe all possible side effects. Call your doctor for medical advice about side effects. You may report side effects to FDA at 1-800-FDA-1088. Where should I keep my medicine? Keep out of the reach of children. Store at room temperature below 30 degrees C (86 degrees F). Protect from light. Keep container tightly closed. Throw away any unused medicine after the expiration date. NOTE: This sheet is a summary. It may not cover all possible information. If you have questions about this medicine, talk to your doctor, pharmacist, or health care provider.  2020 Elsevier/Gold Standard (2010-01-31 18:06:11)

## 2020-05-08 NOTE — Progress Notes (Signed)
SUBJECTIVE:   CHIEF COMPLAINT / HPI:   Diabetes concerns  Patient is very stressed regarding her diabetes control.  Hemoglobin A1c last check was over 7.  She reports that she feels when she is hyperglycemic and that recently she went and had a biscuit from [employer] and checked her blood sugar and it was up to 306.  She is compliant with her Metformin but says that it is extremely stressful thinking that her diabetes may be getting worse.  Denies any signs or symptoms of hypoglycemia.  Has a report of the blood sugars the day she went and had her biscuit and her fasting was in the high 100s to low 200s which increased with meals.  Anxiety  Patient reports extreme anxiety around health conditions.  Reports problems with depression as well but denies any SI or HI.  Reports that she has to work to get out of bed every day but does not wish that she were dead and has no thoughts of harming herself or anyone else.  She is currently taking venlafaxine and she takes Wellbutrin some nights when she has had a rough day.  Feels like her heart is racing all the time and her nerves are on and.  We discussed if she would like to speak with therapist or psychologist/psychiatrist and patient is not interested at this time but will think about it.   OBJECTIVE:   BP 128/72   Pulse 82   Ht 5\' 5"  (1.651 m)   Wt 122 lb 3.2 oz (55.4 kg)   LMP 03/22/2016   SpO2 99%   BMI 20.34 kg/m   General: Anxious appearing sitting in chair next to exam table Cardiac: Tachycardic initially but becomes regular rate after a few deep breathing exercises. Respiratory: Normal work of breathing, her lungs are clear to auscultation bilaterally Abdomen: Soft, nontender, positive bowel sounds Psych: Denies SI/HI.  GAD-7 and PHQ-9 below  PHQ9 SCORE ONLY 05/08/2020 08/08/2019 05/18/2019  PHQ-9 Total Score 19 0 0   GAD 7 : Generalized Anxiety Score 05/08/2020 01/11/2018 09/14/2017 09/29/2016  Nervous, Anxious, on Edge 3 3 3 1     Control/stop worrying 1 2 1 1   Worry too much - different things 1 2 2 2   Trouble relaxing 3 2 2  0  Restless 3 2 2 1   Easily annoyed or irritable 3 2 2 1   Afraid - awful might happen 1 0 0 0  Total GAD 7 Score 15 13 12 6   Anxiety Difficulty Very difficult Somewhat difficult - Somewhat difficult     ASSESSMENT/PLAN:   Diabetes mellitus without complication (HCC) Hemoglobin A1c 6.9 from 7.6 at previous check.  Patient currently taking Metformin which she reports good compliance with. -Continue Metformin as prescribed -Follow-up in 3 months for repeat hemoglobin A1c   Generalized anxiety disorder Patient having issues with anxiety which she reports are worsening recently.  She has known history of generalized anxiety disorder.  She is currently on venlafaxine 150 mg as well as Wellbutrin intermittently.  Discussed seeing psychiatry/psychology or therapy and patient declines at this time.  Is interested in changing medications.  GAD-7 score of 15, PHQ-9 score of 19.  Denies SI or HI. -Tapering venlafaxine at this time, encouraged to take 75 mg daily and she will follow-up in approximately 2 weeks -Plan to transition to SSRI for generalized anxiety disorder -Prescribed buspirone 5 mg twice daily -Strict return precautions given     10/01/2016, MD [facility]

## 2020-05-08 NOTE — Assessment & Plan Note (Signed)
Patient having issues with anxiety which she reports are worsening recently.  She has known history of generalized anxiety disorder.  She is currently on venlafaxine 150 mg as well as Wellbutrin intermittently.  Discussed seeing psychiatry/psychology or therapy and patient declines at this time.  Is interested in changing medications.  GAD-7 score of 15, PHQ-9 score of 19.  Denies SI or HI. -Tapering venlafaxine at this time, encouraged to take 75 mg daily and she will follow-up in approximately 2 weeks -Plan to transition to SSRI for generalized anxiety disorder -Prescribed buspirone 5 mg twice daily -Strict return precautions given

## 2020-05-21 ENCOUNTER — Other Ambulatory Visit: Payer: Self-pay | Admitting: Family Medicine

## 2020-05-23 ENCOUNTER — Encounter: Payer: Self-pay | Admitting: Family Medicine

## 2020-05-25 ENCOUNTER — Encounter: Payer: Self-pay | Admitting: Family Medicine

## 2020-05-25 ENCOUNTER — Ambulatory Visit (INDEPENDENT_AMBULATORY_CARE_PROVIDER_SITE_OTHER): Payer: Commercial Managed Care - PPO | Admitting: Family Medicine

## 2020-05-25 ENCOUNTER — Other Ambulatory Visit: Payer: Self-pay

## 2020-05-25 VITALS — BP 122/70 | HR 88 | Ht 65.0 in | Wt 126.0 lb

## 2020-05-25 DIAGNOSIS — Z1231 Encounter for screening mammogram for malignant neoplasm of breast: Secondary | ICD-10-CM | POA: Diagnosis not present

## 2020-05-25 DIAGNOSIS — F339 Major depressive disorder, recurrent, unspecified: Secondary | ICD-10-CM | POA: Diagnosis not present

## 2020-05-25 DIAGNOSIS — F411 Generalized anxiety disorder: Secondary | ICD-10-CM | POA: Diagnosis not present

## 2020-05-25 DIAGNOSIS — E785 Hyperlipidemia, unspecified: Secondary | ICD-10-CM

## 2020-05-25 HISTORY — DX: Encounter for screening mammogram for malignant neoplasm of breast: Z12.31

## 2020-05-25 MED ORDER — FLUOXETINE HCL 20 MG PO TABS: 10.0000 mg | ORAL_TABLET | Freq: Every day | ORAL | 0 refills | Status: DC

## 2020-05-25 MED ORDER — BUSPIRONE HCL 5 MG PO TABS: 5.0000 mg | ORAL_TABLET | Freq: Two times a day (BID) | ORAL | 0 refills | Status: DC

## 2020-05-25 MED ORDER — VENLAFAXINE HCL ER 37.5 MG PO CP24: 37.5000 mg | ORAL_CAPSULE | Freq: Every day | ORAL | 0 refills | Status: DC

## 2020-05-25 NOTE — Assessment & Plan Note (Signed)
Order lipid panel. Not currently on medication.

## 2020-05-25 NOTE — Addendum Note (Signed)
Addended by: Manson Passey, Lametria Klunk on: 05/25/2020 08:36 PM   Modules accepted: Level of Service

## 2020-05-25 NOTE — Assessment & Plan Note (Addendum)
Cross taper schedule  Week 1: Venlafaxine 37.5 mg daily, BuSpar 5 mg twice daily Week 2: Venlafaxine 37.5 mg daily, BuSpar 5 mg twice daily, and Prozac 10 mg daily Follow appointment in 2 weeks to continue taper schedule.

## 2020-05-25 NOTE — Patient Instructions (Addendum)
It was wonderful to meet you in person today. Thank you for allowing me to be a part of your care. Below is a short summary of what we discussed at your visit today:  Anxiety and depression I am very glad to hear that you have had improvement with your symptoms.  We will do a 2-week taper between venlafaxine and start Prozac as detailed below.  Please come back in 2 weeks to see me and we will keep on our taper.  If you experience any worsening symptoms of depression or anxiety, including suicidal or homicidal thoughts please not hesitate to reach out to [place].  Week 1: Take venlafaxine 37.5 mg (one capsule) every day (this is half the dose you have been taking) Week 2: Take venlafaxine 37.5 mg (one capsule) every day and also Prozac 10 mg (half tablet) every day. Week 3: Come back to see me to continue the taper.  Emotional support animal We discussed, emotional support animals are not fully protected by the Americans with disabilities act.  Official read research, it appears that there are no state laws either protect emotional support animals.  Please do not [place] [employer] [place] for animal, as this is a scam without any laws behind it.   On the other hand, in the future if you are experiencing problems with the landlord and need a letter stating that your animal helps you with your anxiety, please come back.  Health maintenance I have ordered a lipid panel for you to check your cholesterol.  You may come back at any time to have your lab work done at the clinic Monday through Friday 8:30-4:30.  You will need to be fasting for this lab, meaning you have not eaten anything or drink liquids besides water for the past 8 hours.  The easiest thing to do will probably become first thing in the morning for this lab.  Please also call the breast center of [facility] imaging to schedule your mammogram.  Refer to the attached card.  Their phone number is [phone].  This may be done at your  convenience.   If you have any questions or concerns, please do not hesitate to contact us via phone or MyChart message.   [name], MD

## 2020-05-25 NOTE — Progress Notes (Signed)
    SUBJECTIVE:   CHIEF COMPLAINT / HPI:   Anxiety and depression At a visit 2 weeks ago, she started the process of switching from venlafaxine and bupropion to Prozac and BuSpar. This is so she may have a reduction in anxiety symptoms which are predominantly her issue right now. She reports that in the last couple weeks while being off of bupropion and on venlafaxine and BuSpar, she has experienced reduction in her anxiety symptoms and has felt more energy. She is very pleased with the results would like to continue. No SI, HI.  Emotional support animals Asks if there was a process we could help her with for getting her dog certified as emotional support animal. We discussed that emotional support animals are not protected that early by the ADA or by any state laws in [place]. However, I did let her know that if she was experiencing specific difficulty with the landlord and that landlord wanted a doctor's note saying her animal was beneficial for her anxiety and depression that I would provide such a note.  PERTINENT  PMH / PSH: Type 2 diabetes, generalized anxiety disorder, depression, bilateral tenderness, hyperlipidemia  OBJECTIVE:   BP 122/70   Pulse 88   Ht 5\' 5"  (1.651 m)   Wt 126 lb (57.2 kg)   LMP 03/22/2016   SpO2 97%   BMI 20.97 kg/m     PHQ-9:  Depression screen [facility] 2/9 05/25/2020 05/08/2020 08/08/2019  Decreased Interest 2 3 0  Down, Depressed, Hopeless 1 2 0  PHQ - 2 Score 3 5 0  Altered sleeping 0 3 -  Tired, decreased energy 2 3 -  Change in appetite 0 1 -  Feeling bad or failure about yourself  1 2 -  Trouble concentrating 2 2 -  Moving slowly or fidgety/restless 3 3 -  Suicidal thoughts 0 0 -  PHQ-9 Score 11 19 -  Difficult doing work/chores - Very difficult -     GAD-7:  GAD 7 : Generalized Anxiety Score 05/25/2020 05/08/2020 01/11/2018 09/14/2017  Nervous, Anxious, on Edge 2 3 3 3   Control/stop worrying 1 1 2 1   Worry too much - different things 2 1 2  2   Trouble relaxing 2 3 2 2   Restless 1 3 2 2   Easily annoyed or irritable 3 3 2 2   Afraid - awful might happen 0 1 0 0  Total GAD 7 Score 11 15 13 12   Anxiety Difficulty - Very difficult Somewhat difficult -      Physical Exam General: Awake, alert, oriented Cardiovascular: Regular rate and rhythm, S1 and S2 present, no murmurs auscultated Respiratory: Lung fields clear to auscultation bilaterally Extremities: No bilateral lower extremity edema, palpable pedal and pretibial pulses bilaterally Neuro: Cranial nerves II through X grossly intact, able to move all extremities spontaneously   ASSESSMENT/PLAN:   Generalized anxiety disorder Cross taper schedule  Week 1: Venlafaxine 37.5 mg daily, BuSpar 5 mg twice daily Week 2: Venlafaxine 37.5 mg daily, BuSpar 5 mg twice daily, and Prozac 10 mg daily Follow appointment in 2 weeks to continue taper schedule.  Hyperlipidemia Order lipid panel. Not currently on medication.  Breast cancer screening by mammogram Active mammogram order. Patient has not gotten it yet because of a lapse in insurance. Now has insurance and the phone number to [facility] imaging center. Will schedule.     , MD [facility]

## 2020-05-25 NOTE — Assessment & Plan Note (Signed)
Active mammogram order. Patient has not gotten it yet because of a lapse in insurance. Now has insurance and the phone number to [facility] imaging center. Will schedule.

## 2020-05-29 ENCOUNTER — Ambulatory Visit: Payer: Self-pay

## 2020-06-05 ENCOUNTER — Encounter: Payer: Self-pay | Admitting: Family Medicine

## 2020-06-06 ENCOUNTER — Encounter: Payer: Self-pay | Admitting: Family Medicine

## 2020-06-08 ENCOUNTER — Other Ambulatory Visit: Payer: Self-pay

## 2020-06-08 ENCOUNTER — Ambulatory Visit (INDEPENDENT_AMBULATORY_CARE_PROVIDER_SITE_OTHER): Payer: Commercial Managed Care - PPO | Admitting: Family Medicine

## 2020-06-08 ENCOUNTER — Encounter: Payer: Self-pay | Admitting: Family Medicine

## 2020-06-08 ENCOUNTER — Other Ambulatory Visit: Payer: Self-pay | Admitting: Family Medicine

## 2020-06-08 DIAGNOSIS — F339 Major depressive disorder, recurrent, unspecified: Secondary | ICD-10-CM

## 2020-06-08 DIAGNOSIS — F411 Generalized anxiety disorder: Secondary | ICD-10-CM

## 2020-06-08 DIAGNOSIS — G4440 Drug-induced headache, not elsewhere classified, not intractable: Secondary | ICD-10-CM | POA: Diagnosis not present

## 2020-06-08 MED ORDER — VENLAFAXINE HCL ER 75 MG PO CP24: 75.0000 mg | ORAL_CAPSULE | Freq: Every day | ORAL | 0 refills | Status: DC

## 2020-06-08 NOTE — Progress Notes (Signed)
Cross taper schedule going forward:  - Stop Venlafaxine - Increase Prozac from 10 mg to 20 mg daily - Keep BuSpar at 5 mg BID - Follow up appointment in two weeks to monitor symptoms and taper side effects  [name], MD

## 2020-06-08 NOTE — Assessment & Plan Note (Addendum)
Given the patient has new headache after starting Prozac, believe that this is because of her new daily headache.  Encourage patient to continue using over-the-counter medications to relieve any pain from her headache.  Encourage regular hydration.  Patient was in agreement with this plan and voiced understanding.  Informed her that if headache persists despite medications or she develops any nausea/vomiting, vision changes, dizziness or weakness to be evaluated immediately.  Patient voiced understanding.  Also counseled patient that headache may take a few days to resolve after stopping the Prozac.

## 2020-06-08 NOTE — Assessment & Plan Note (Addendum)
Discontinue Prozac Increase Effexor back to 75 mg daily Continue BuSpar at 5 mg twice daily Follow-up in 2 weeks for mood check and to see toleration of medication changes

## 2020-06-08 NOTE — Progress Notes (Addendum)
    SUBJECTIVE:   CHIEF COMPLAINT / HPI: medication management   Patient previously on venlafaxine taper and started on BuSpar and Prozac. She reports being able to tolerate these medications but is experiencing intermittent headaches since starting the Prozac.  Patient reports very she felt her best and most motivated with decreased symptoms of depression or anxiety when she was on the 75 mg of Effexor.  With the 5 mg twice daily of BuSpar.  Patient is also hoping to stop the Prozac as she associates it with her headaches given the headache started on Saturday and have been occurring daily and she has been on this medication for the last week and a half.  Patient denies any suicidal ideation today. GAD7 score of 4  PHQ9 score of 12   Headache  Patient reports having a daily headache for 6 days without aura nor nausea. Patient reports that HA feels like a migraine. She has been taking Aleeve to help with pain and reports that this has been helpful. She denies associated nausea, emesis, dizziness/lightheadness, weakness/numbness of extremities or any gait disturbance. She does not report any vision changes and describes the HA as squeezing in character.   PERTINENT  PMH / PSH: Anxiety  OBJECTIVE:   BP 122/80   Pulse 89   Wt 125 lb 9.6 oz (57 kg)   LMP 03/22/2016   SpO2 98%   BMI 20.90 kg/m    General: Female appearing stated age in no acute distress Cardio: Normal S1 and S2, no S3 or S4. Rhythm is regular. No murmurs or rubs.  Bilateral radial pulses palpable Pulm: Clear to auscultation bilaterally, no crackles, wheezing, or diminished breath sounds. Normal respiratory effort  Mental status exam Appearance: well groomed female, alert and attentive to examiner  Speech: Normal rate of speech, normal volume without dysarthria, spontaneous Mood: "just kinda there, i'm not happy and i'm not sad" Affect: Congruent Thought content: content regarding medication and CC, appropriate,no  Suicidal ideation Thought process: coherent and logical, appropriate for situation  Insight: Patient demonstrates appropriate awareness of illness  Judgment: Judgment is intact Motor activity: no psychomotor retardation, subtle fidgeting  Orientation: Alert & oriented to person, place, time & situation Intellect: above average Perception: no objective findings of A/V hallucinations SI/HI:denies   ASSESSMENT/PLAN:   Generalized anxiety disorder Discontinue Prozac Increase Effexor back to 75 mg daily Continue BuSpar at 5 mg twice daily Follow-up in 2 weeks for mood check and to see toleration of medication changes  Headache, drug induced Given the patient has new headache after starting Prozac, believe that this is because of her new daily headache.  Encourage patient to continue using over-the-counter medications to relieve any pain from her headache.  Encourage regular hydration.  Patient was in agreement with this plan and voiced understanding.  Informed her that if headache persists despite medications or she develops any nausea/vomiting, vision changes, dizziness or weakness to be evaluated immediately.  Patient voiced understanding.  Also counseled patient that headache may take a few days to resolve after stopping the Prozac.    Ronnald Ramp, MD [facility]

## 2020-06-08 NOTE — Patient Instructions (Addendum)
  I will recommend that you take 75 mg of the Effexor along with 5 mg twice daily of the BuSpar.  We stop the Prozac today.    Please note that it may take a few days for the headaches to completely resolve.  Please continue to take over-the-counter medications to help with your pain for your headache.  Also recommend staying hydrated with plenty of oral fluids.  Please follow-up in 2 weeks for further medication management.

## 2020-06-21 NOTE — Progress Notes (Signed)
    SUBJECTIVE:   CHIEF COMPLAINT / HPI: mood check for GAD   Depression & Anxiety Patient reports feeling much better with no longer having HA symptoms. Patient reports that she feels much better on the Effexor and BuSpar without prozac.  She reports that she had some body aches while taking the Prozac but those have resolved since stopping the medication.  Patient has GAD score of 3 today.  PHQ9 SCORE ONLY 06/22/2020 06/08/2020 05/25/2020  PHQ-9 Total Score 5 12 11    Desire for STI testing Patient reports sex with new partner soon after thanksgiving and noticed a new rash around her vulva and extending to her rectum.  She reports that the rash was very irritating but did not notice any ulcers or sores.  She states that she also had a yeast infection at this time that she was able to treat with Monistat.  The rash however took a few weeks to resolve .Reports some postcoital bleeding, scant, that last for a few days. Denies foul odor and had skin irritation that burned during urination.  Patient reports that they did not use barrier contraception during this encounter.  PERTINENT  PMH / PSH:  Depression  GAD   OBJECTIVE:   BP 120/62   Pulse (!) 101   Wt 123 lb 6.4 oz (56 kg)   LMP 03/22/2016   SpO2 98%   BMI 20.53 kg/m   Physical exam General: female alert, making appropriate eye contact, well groomed, calm appearing Motor activity: no psychomotor agitation or slowing Speech: normal rate, volume and fluency  Mood: patient states mood is "great" Thought Content: coherent, no delusions, no A/V hallucincations, demonstrates normal insight & judgement Cognition: oriented to time, place and person  Genitalia:  Normal introitus for age, no external lesions, no vaginal discharge, mucosa pink and moist, no vaginal or cervical lesions, no vaginal atrophy, no friaility or hemorrhage, normal uterus size and position, no adnexal masses or tenderness   ASSESSMENT/PLAN:   Vaginal  irritation Patient likely experiencing secondary gain in of the vulvar irritation due to previous yeast infection.  Patient requesting STI testing as she has not been sexually active with this partner for prolonged period of time. Testing for gonorrhea chlamydia Testing for HIV and syphilis Wet prep  Depression, recurrent (HCC) Patient's mood improved subjectively and objectively with improved PHQ9 score.  - continue Buspar 5mg  BID  - continue Effexor 75mg   - f/u with PCP      03/24/2016, MD [facility]

## 2020-06-22 ENCOUNTER — Encounter: Payer: Self-pay | Admitting: Family Medicine

## 2020-06-22 ENCOUNTER — Other Ambulatory Visit (HOSPITAL_COMMUNITY)
Admission: RE | Admit: 2020-06-22 | Discharge: 2020-06-22 | Disposition: A | Discharge status: Home / Self Care | Payer: Commercial Managed Care - PPO | Source: Ambulatory Visit | Attending: Family Medicine | Admitting: Family Medicine

## 2020-06-22 ENCOUNTER — Other Ambulatory Visit: Payer: Self-pay

## 2020-06-22 ENCOUNTER — Ambulatory Visit: Payer: Commercial Managed Care - PPO | Admitting: Family Medicine

## 2020-06-22 VITALS — BP 120/62 | HR 101 | Wt 123.4 lb

## 2020-06-22 DIAGNOSIS — N898 Other specified noninflammatory disorders of vagina: Secondary | ICD-10-CM | POA: Diagnosis not present

## 2020-06-22 DIAGNOSIS — F339 Major depressive disorder, recurrent, unspecified: Secondary | ICD-10-CM

## 2020-06-22 DIAGNOSIS — E119 Type 2 diabetes mellitus without complications: Secondary | ICD-10-CM | POA: Diagnosis not present

## 2020-06-22 LAB — POCT WET PREP (WET MOUNT)
Clue Cells Wet Prep Whiff POC: NEGATIVE
Trichomonas Wet Prep HPF POC: ABSENT

## 2020-06-22 LAB — POCT UA - MICROALBUMIN
Creatinine, POC: 100 mg/dL
Microalbumin Ur, POC: 80 mg/L

## 2020-06-22 NOTE — Patient Instructions (Addendum)
Keep Using flonase nasal spray and try zyrtec or allegra for congestion symptoms    Today we tested for sexually transmitted infections and we will notify you of the results once those are available.  I am happy to see that you are depression/anxiety symptoms have improved. Please continue to take your Effexor and BuSpar as prescribed.

## 2020-06-22 NOTE — Assessment & Plan Note (Addendum)
Patient likely experiencing secondary gain in of the vulvar irritation due to previous yeast infection.  Patient requesting STI testing as she has not been sexually active with this partner for prolonged period of time. Testing for gonorrhea chlamydia Testing for HIV and syphilis Wet prep

## 2020-06-23 LAB — RPR: RPR Ser Ql: NONREACTIVE

## 2020-06-23 LAB — HIV ANTIBODY (ROUTINE TESTING W REFLEX): HIV Screen 4th Generation wRfx: NONREACTIVE

## 2020-06-24 ENCOUNTER — Other Ambulatory Visit: Payer: Self-pay | Admitting: Family Medicine

## 2020-06-24 DIAGNOSIS — F411 Generalized anxiety disorder: Secondary | ICD-10-CM

## 2020-06-24 DIAGNOSIS — F339 Major depressive disorder, recurrent, unspecified: Secondary | ICD-10-CM

## 2020-06-24 NOTE — Assessment & Plan Note (Signed)
Patient's mood improved subjectively and objectively with improved PHQ9 score.  - continue Buspar 5mg  BID  - continue Effexor 75mg   - f/u with PCP

## 2020-06-25 ENCOUNTER — Encounter: Payer: Self-pay | Admitting: Family Medicine

## 2020-06-25 LAB — CERVICOVAGINAL ANCILLARY ONLY
Chlamydia: NEGATIVE
Comment: NEGATIVE
Comment: NORMAL
Neisseria Gonorrhea: NEGATIVE

## 2020-06-27 ENCOUNTER — Ambulatory Visit: Payer: Self-pay | Admitting: Family Medicine

## 2020-07-10 ENCOUNTER — Other Ambulatory Visit: Payer: Self-pay

## 2020-07-11 MED ORDER — METFORMIN HCL ER 500 MG PO TB24: ORAL_TABLET | ORAL | 0 refills | Status: DC

## 2020-07-20 ENCOUNTER — Other Ambulatory Visit: Payer: Self-pay | Admitting: Family Medicine

## 2020-07-20 DIAGNOSIS — F339 Major depressive disorder, recurrent, unspecified: Secondary | ICD-10-CM

## 2020-07-20 DIAGNOSIS — F411 Generalized anxiety disorder: Secondary | ICD-10-CM

## 2020-07-20 MED ORDER — VENLAFAXINE HCL ER 75 MG PO CP24: 75.0000 mg | ORAL_CAPSULE | Freq: Every day | ORAL | 1 refills | Status: DC

## 2020-07-20 NOTE — Progress Notes (Signed)
Pharmacy requesting dx codes for 90 day script of venlafaxine. Had difficulty changing that within the Epic system refill request, so cancelled that request and re-ordered here.   [name], MD

## 2020-08-25 ENCOUNTER — Other Ambulatory Visit: Payer: Self-pay | Admitting: Family Medicine

## 2020-08-25 DIAGNOSIS — F411 Generalized anxiety disorder: Secondary | ICD-10-CM

## 2020-08-28 IMAGING — CT CT RENAL STONE PROTOCOL
2 of 4 series · 16 of 46 positions shown, 18 images · non-contrast
Comparison: None.

CLINICAL DATA: Left-sided flank pain for 1 day

EXAM:
CT ABDOMEN AND PELVIS WITHOUT CONTRAST
TECHNIQUE: Multidetector CT imaging of the abdomen and pelvis was performed
following the standard protocol without IV contrast.

[Series 2: axial st · axial · 0.68mm/px · z∈[+1078,+1453]mm · 13 of 85 slices shown, 15 images]
[im 5/85  soft-tissue]
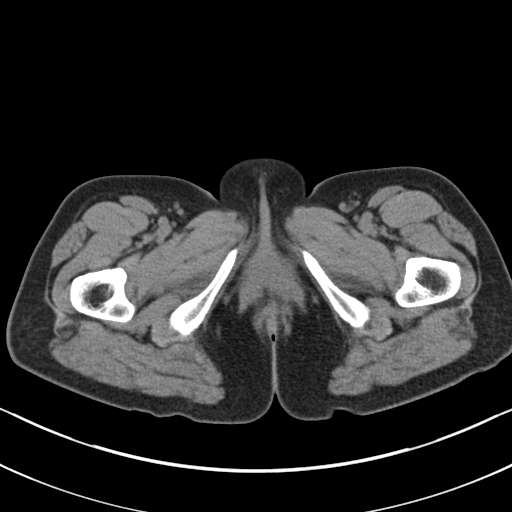
[im 5/85  bone]
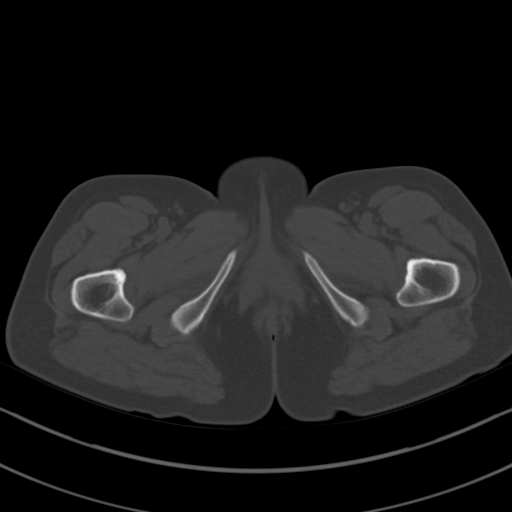
[im 10/85  soft-tissue]
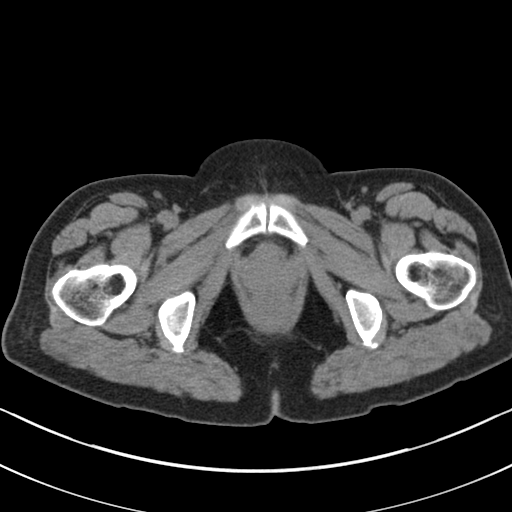
[im 20/85  soft-tissue]
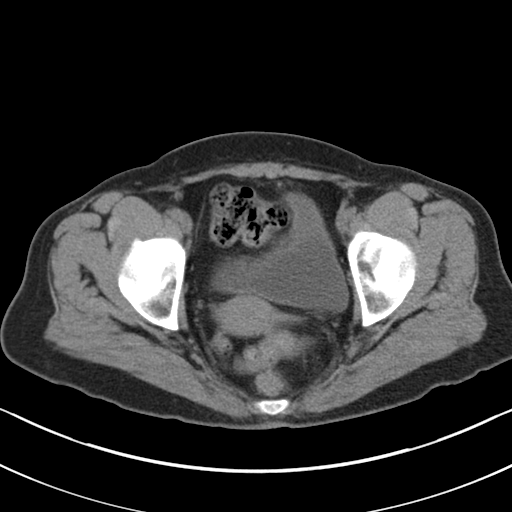
[im 25/85  soft-tissue]
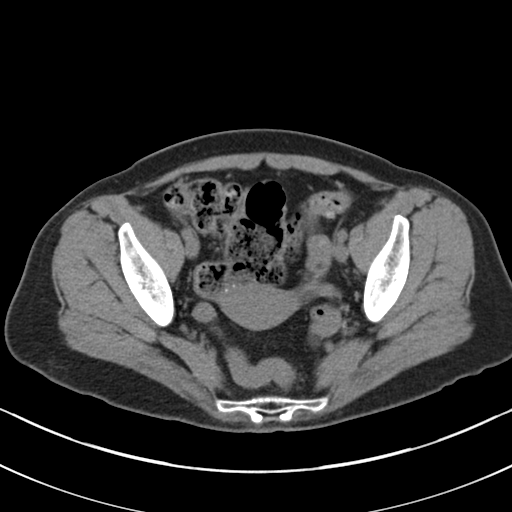
[im 30/85  soft-tissue]
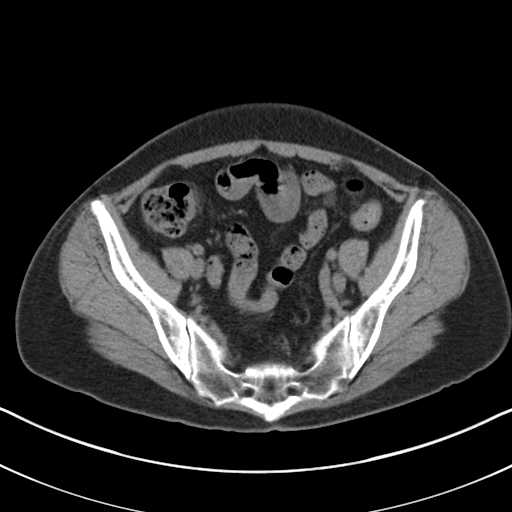
[im 35/85  soft-tissue]
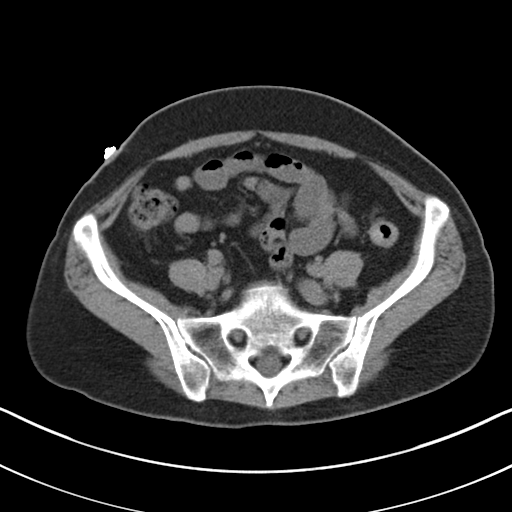
[im 45/85  soft-tissue]
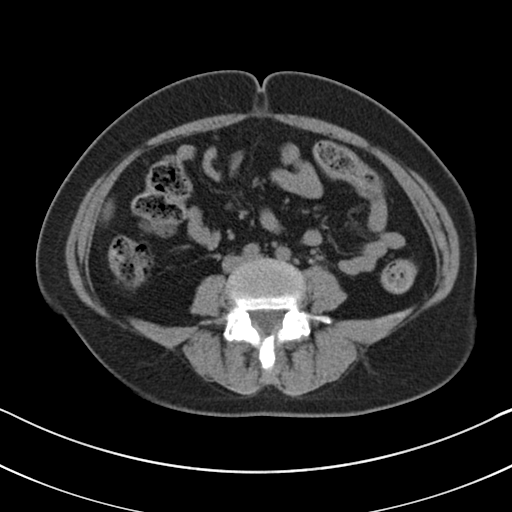
[im 50/85  soft-tissue]
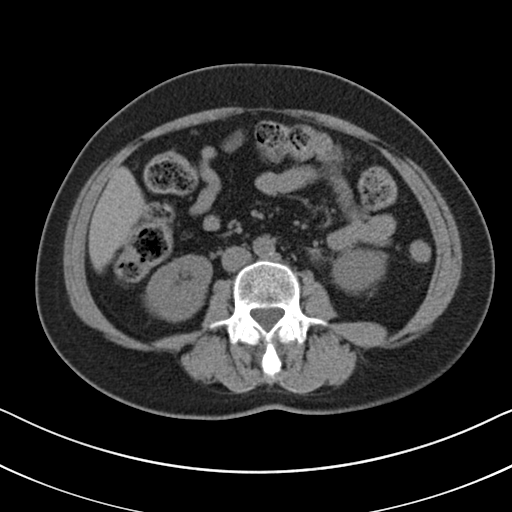
[im 55/85  soft-tissue]
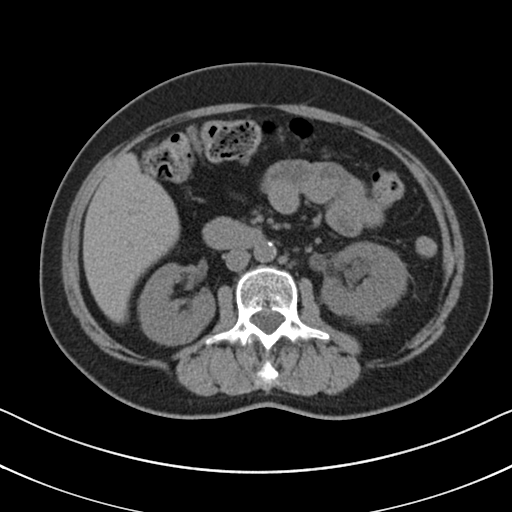
[im 55/85  bone]
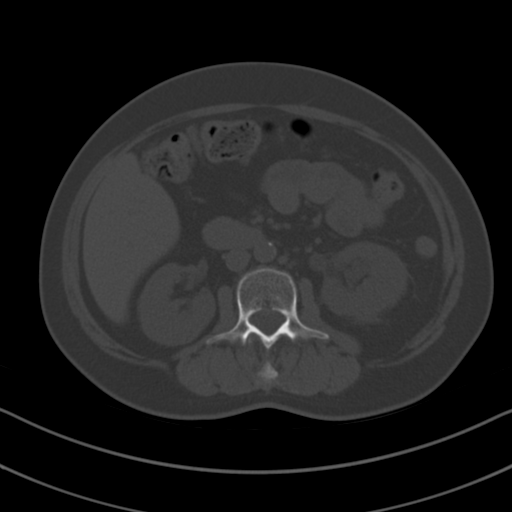
[im 60/85  soft-tissue]
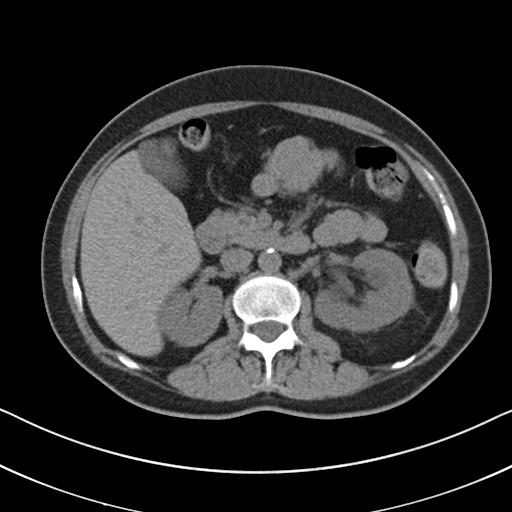
[im 65/85  soft-tissue]
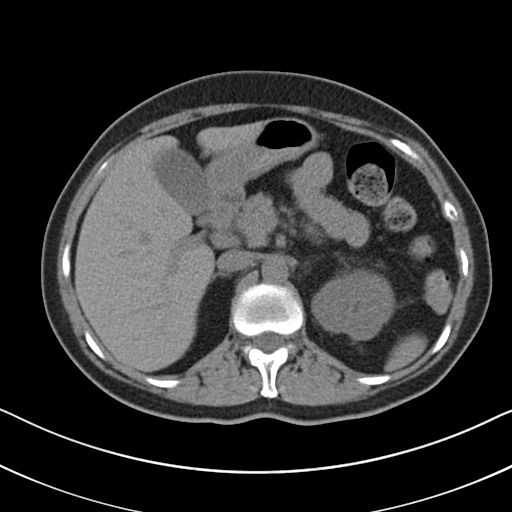
[im 75/85  soft-tissue]
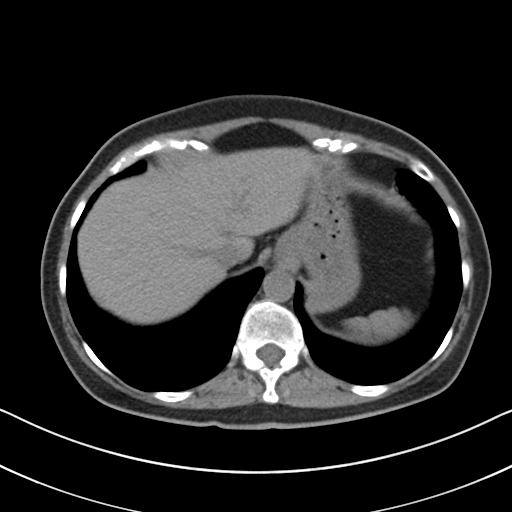
[im 80/85  soft-tissue]
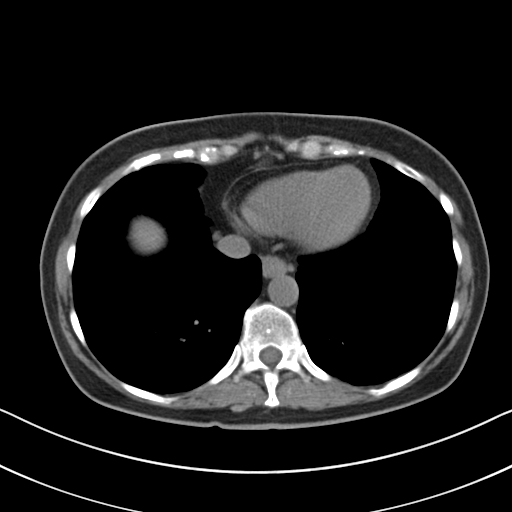

[Series 4: coronal · coronal · 0.67mm/px · 3 of 127 slices shown]
[im 43/127  soft-tissue]
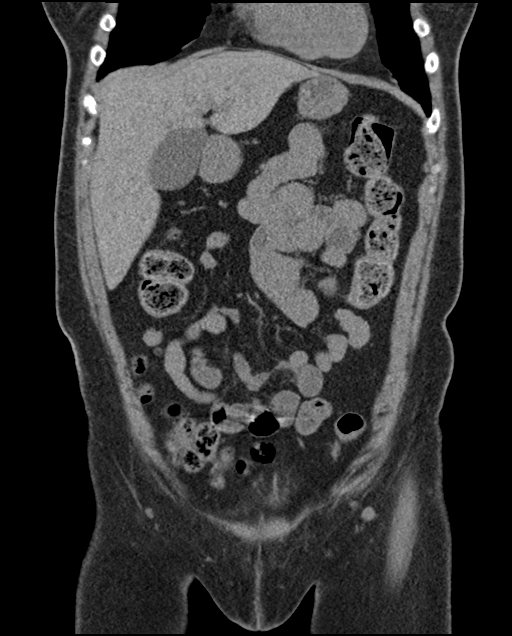
[im 57/127  soft-tissue]
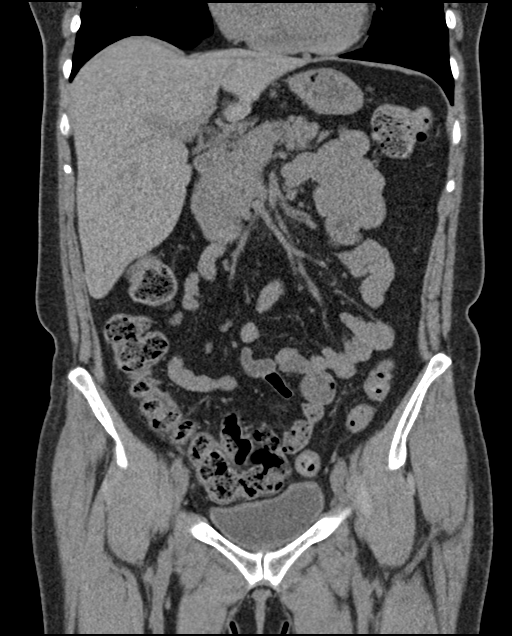
[im 71/127  soft-tissue]
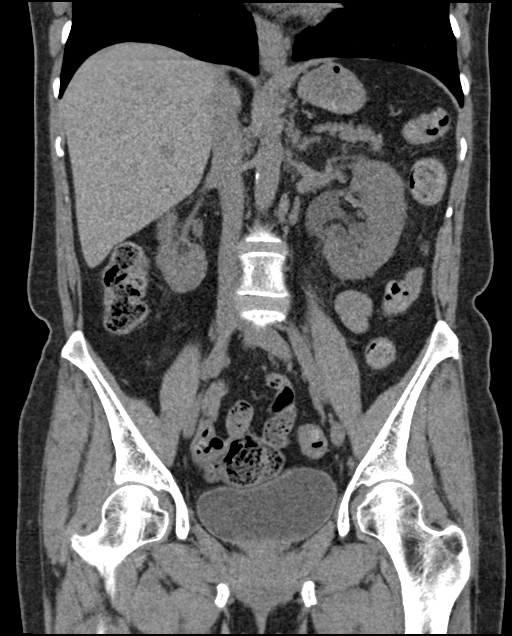

[16 of 46 positions shown; findings below may reference images not displayed]

FINDINGS: Lower chest: No acute abnormality.

Hepatobiliary: No focal liver abnormality is seen. No gallstones,
gallbladder wall thickening, or biliary dilatation.

Pancreas: Unremarkable. No pancreatic ductal dilatation or
surrounding inflammatory changes.

Spleen: Normal in size without focal abnormality.

Adrenals/Urinary Tract: Adrenal glands are within normal limits.
Right kidney is well visualized without focal abnormality. Left
kidney demonstrates evidence of mild perinephric stranding and mild
hydronephrosis extending to the left mid ureter where a 3 mm
obstructing stone is seen. This is best visualized on image number
40 of series 2 and image number 69 of series 4. The more distal left
ureter is within normal limits. Bladder is partially distended.

Stomach/Bowel: Colon shows no obstructive or inflammatory changes.
Appendix is not visualized. No inflammatory changes to suggest
appendicitis are seen. Small bowel and stomach appear within normal
limits.

Vascular/Lymphatic: Aortic atherosclerosis. No enlarged abdominal or
pelvic lymph nodes.

Reproductive: Uterus and bilateral adnexa are unremarkable.

Other: No abdominal wall hernia or abnormality. No abdominopelvic
ascites.

Musculoskeletal: No acute or significant osseous findings.
IMPRESSION: 3 mm partially obstructing stone left mid ureter with subsequent
hydronephrosis.

No other focal abnormality is noted.

## 2020-09-11 ENCOUNTER — Other Ambulatory Visit: Payer: Self-pay | Admitting: Family Medicine

## 2020-09-11 DIAGNOSIS — F411 Generalized anxiety disorder: Secondary | ICD-10-CM

## 2020-09-17 ENCOUNTER — Encounter: Payer: Self-pay | Admitting: Family Medicine

## 2020-09-19 ENCOUNTER — Other Ambulatory Visit: Payer: Self-pay | Admitting: Family Medicine

## 2020-09-19 NOTE — Progress Notes (Signed)
Per patient request (and as previously discussed at earlier visit), this is a letter in support of Joy Bautista' pets to be considered emotional support animals.   [name], MD

## 2020-10-06 ENCOUNTER — Other Ambulatory Visit: Payer: Self-pay | Admitting: Family Medicine

## 2020-11-17 ENCOUNTER — Encounter: Payer: Self-pay | Admitting: Family Medicine

## 2021-01-18 ENCOUNTER — Encounter: Payer: Self-pay | Admitting: Family Medicine

## 2021-01-18 DIAGNOSIS — F411 Generalized anxiety disorder: Secondary | ICD-10-CM

## 2021-01-18 DIAGNOSIS — F339 Major depressive disorder, recurrent, unspecified: Secondary | ICD-10-CM

## 2021-01-22 ENCOUNTER — Other Ambulatory Visit: Payer: Self-pay | Admitting: Family Medicine

## 2021-01-22 DIAGNOSIS — F411 Generalized anxiety disorder: Secondary | ICD-10-CM

## 2021-01-22 DIAGNOSIS — E119 Type 2 diabetes mellitus without complications: Secondary | ICD-10-CM

## 2021-01-22 DIAGNOSIS — F339 Major depressive disorder, recurrent, unspecified: Secondary | ICD-10-CM

## 2021-01-22 MED ORDER — VENLAFAXINE HCL ER 75 MG PO CP24: 75.0000 mg | ORAL_CAPSULE | Freq: Every day | ORAL | 1 refills | Status: DC

## 2021-01-22 MED ORDER — METFORMIN HCL ER 500 MG PO TB24: ORAL_TABLET | ORAL | 0 refills | Status: DC

## 2021-01-22 MED ORDER — BUSPIRONE HCL 5 MG PO TABS: 5.0000 mg | ORAL_TABLET | Freq: Two times a day (BID) | ORAL | 1 refills | Status: DC

## 2021-01-22 NOTE — Progress Notes (Signed)
90 day med scripts sent electronically to [facility]. Of note, received triplicate faxes script requests from [facility] pharmacy last week and sent in one copy via fax.   [name], MD

## 2021-01-22 NOTE — Telephone Encounter (Signed)
Received phone call from [facility] pharmacy regarding prescriptions. Rejection states that medications were sent via another route. Pharmacy states that they have not received anything.   Please advise.   Veronda Prude, RN

## 2021-02-05 ENCOUNTER — Other Ambulatory Visit: Payer: Self-pay

## 2021-02-05 ENCOUNTER — Ambulatory Visit (INDEPENDENT_AMBULATORY_CARE_PROVIDER_SITE_OTHER): Payer: Commercial Managed Care - PPO | Admitting: Family Medicine

## 2021-02-05 ENCOUNTER — Encounter: Payer: Self-pay | Admitting: Family Medicine

## 2021-02-05 VITALS — BP 122/60 | Ht 65.0 in | Wt 124.0 lb

## 2021-02-05 DIAGNOSIS — E119 Type 2 diabetes mellitus without complications: Secondary | ICD-10-CM

## 2021-02-05 DIAGNOSIS — G8929 Other chronic pain: Secondary | ICD-10-CM | POA: Diagnosis not present

## 2021-02-05 DIAGNOSIS — M25511 Pain in right shoulder: Secondary | ICD-10-CM | POA: Diagnosis not present

## 2021-02-05 LAB — POCT GLYCOSYLATED HEMOGLOBIN (HGB A1C): HbA1c, POC (controlled diabetic range): 7 % (ref 0.0–7.0)

## 2021-02-05 NOTE — Patient Instructions (Signed)
It was great seeing you today.  I am sorry you are having these issues with your right shoulder.  I have placed a referral for you to see sports medicine and they can give you an injection in your shoulder and then refer you to physical therapy.  I recommend you take Tylenol arthritic pain for the discomfort.  If you have any questions or concerns please call the clinic.  I hope you have a wonderful afternoon!

## 2021-02-05 NOTE — Progress Notes (Signed)
    SUBJECTIVE:   CHIEF COMPLAINT / HPI:   Right shoulder pain Patient reports that she has been seen by orthopedics in the past for having a left shoulder issue.  She was diagnosed with frozen shoulder and treated by orthopedics.  She reports that over the last month or so she has been having issues with her right shoulder.  The pain is not as severe as it was with her left shoulder so she is not seek medical attention until now.  This persistent pain is bothering her and she can no longer lift her right shoulder above 90 degrees.  She also has pain with internal and external rotation.  She has not been taking any medications for this.  Diabetes checkup Previous hemoglobin A1c of 6.9.  Currently taking metformin 500 mg once daily with dinner.  She reports good compliance with the medication.  OBJECTIVE:   BP 122/60   Ht 5\' 5"  (1.651 m)   Wt 124 lb (56.2 kg)   LMP 03/22/2016   BMI 20.63 kg/m   General: Well-appearing, no acute distress Respiratory: Normal work of breathing, speaking full sentences MSK: Patient with pain with abduction of right shoulder and cannot abduct past 90 degrees.  Pain with internal and external rotation.    ASSESSMENT/PLAN:   Diabetes mellitus without complication (HCC) Hemoglobin A1c of 7.0 today from 6.9 at previous check.  Currently tolerating metformin well.  We will continue metformin as prescribed and follow-up in 3 months for repeat hemoglobin A1c.  Chronic right shoulder pain Patient with 1 month history of right shoulder pain with decreased range of motion.  Patient has history of frozen shoulder and left shoulder and feels like this is a similar presentation.  It is not as severe as the previous issues but still concerning.  Physical exam consistent with frozen shoulder.  I have referred to sports medicine for evaluation and treatment.  Recommended over-the-counter medications such as Tylenol for discomfort.  Follow-up as needed.     03/24/2016, MD [facility]

## 2021-02-06 DIAGNOSIS — M25511 Pain in right shoulder: Secondary | ICD-10-CM | POA: Insufficient documentation

## 2021-02-06 DIAGNOSIS — G8929 Other chronic pain: Secondary | ICD-10-CM | POA: Insufficient documentation

## 2021-02-06 NOTE — Assessment & Plan Note (Signed)
Hemoglobin A1c of 7.0 today from 6.9 at previous check.  Currently tolerating metformin well.  We will continue metformin as prescribed and follow-up in 3 months for repeat hemoglobin A1c.

## 2021-02-06 NOTE — Assessment & Plan Note (Signed)
Patient with 1 month history of right shoulder pain with decreased range of motion.  Patient has history of frozen shoulder and left shoulder and feels like this is a similar presentation.  It is not as severe as the previous issues but still concerning.  Physical exam consistent with frozen shoulder.  I have referred to sports medicine for evaluation and treatment.  Recommended over-the-counter medications such as Tylenol for discomfort.  Follow-up as needed.

## 2021-02-12 ENCOUNTER — Ambulatory Visit
Admission: RE | Admit: 2021-02-12 | Discharge: 2021-02-12 | Disposition: A | Discharge status: Home / Self Care | Payer: Commercial Managed Care - PPO | Source: Ambulatory Visit | Attending: Sports Medicine | Admitting: Sports Medicine

## 2021-02-12 ENCOUNTER — Ambulatory Visit: Payer: Commercial Managed Care - PPO | Admitting: Sports Medicine

## 2021-02-12 ENCOUNTER — Other Ambulatory Visit: Payer: Self-pay

## 2021-02-12 VITALS — BP 132/74 | Ht 65.0 in | Wt 120.0 lb

## 2021-02-12 DIAGNOSIS — M25511 Pain in right shoulder: Secondary | ICD-10-CM | POA: Diagnosis not present

## 2021-02-12 DIAGNOSIS — M25531 Pain in right wrist: Secondary | ICD-10-CM

## 2021-02-12 DIAGNOSIS — G8929 Other chronic pain: Secondary | ICD-10-CM | POA: Diagnosis not present

## 2021-02-12 MED ORDER — MELOXICAM 15 MG PO TABS: ORAL_TABLET | ORAL | 0 refills | Status: DC

## 2021-02-12 NOTE — Patient Instructions (Signed)
It was great to meet you today!  For your shoulder, please go get an x-ray at your earliest convenience. Start doing the range of motion exercises you were given. We sent a prescription for Meloxicam to your pharmacy. Follow up in 3 weeks and we'll do an ultrasound of your shoulder.  For your wrist, please wear the wrist brace at nighttime.  Call with any questions.

## 2021-02-12 NOTE — Progress Notes (Addendum)
   Subjective:    Patient ID: Joy Bautista, female    DOB: 10-Jan-1966, 55 y.o.   MRN: [id number]  HPI chief complaint: Right shoulder and right hand pain  Mc is a very pleasant 55 year old right-hand-dominant female that comes in today with a couple of different complaints.  First complaint is right shoulder pain that has been present since June without any known trauma.  Pain is diffuse throughout the shoulder and at times will radiate down to the elbow.  She has a history of a left frozen shoulder treated many years ago with injections.  Her current symptoms feel similar to that.  She endorses pain with forward flexion, abduction, and internal rotation.  Difficulty sleeping on this side at night.  Some weakness secondary to pain as well. She is also describing diffuse pain, numbness, and tingling in the right hand.  It has also been present since around June.  Symptoms are worse first thing in the morning.  She notices that she tends to sleep with her hands in a fist.  She feels the need to have to flick her wrist and hand in order to "wake it up".  She has some mild discomfort in her fingers as well.  Past medical history reviewed Medications reviewed Allergies reviewed She works as a Scientist, physiological for a trucking company    Review of Systems As above    Objective:   Physical Exam  Well developed, well nourished.  No acute distress  Right shoulder: Limited active forward flexion to about 130 degrees.  Limited active abduction to about 100 degrees.  Active internal rotation significantly limited to about 30 degrees.  External rotation is about 80 degrees.  Passive external rotation is also 80 degrees compared to 90 degrees in the uninvolved left shoulder.  Positive empty can, positive Hawkins.  Rotator cuff strength is 5/5 but does not cause pain.  No tenderness to palpation at the [facility] joint or bicipital groove.  Neurovascularly intact distally.  Right wrist: Full range of motion.  No  effusion.  No soft tissue swelling.  Negative Tinel's, negative Phalen's.  No atrophy.  She does have bony hypertrophy on multiple PIP joints consistent with OA as well as the DIP of the fifth finger.  Good pulses.      Assessment & Plan:   Right shoulder pain likely secondary to early onset adhesive capsulitis History of left shoulder adhesive capsulitis Right hand and wrist pain likely secondary to carpal tunnel syndrome  For both her right shoulder and her right hand, we will start meloxicam 15 mg daily with food.  She is cautioned about GI upset.  We will also start a cock-up wrist brace for the right hand at night.  Follow-up in 3 weeks for reevaluation and an ultrasound of the right shoulder.  In the meantime, patient will get an AP and axillary right shoulder x-ray to rule out osteoarthritis.  We will discuss those findings at her follow-up visit.  Addendum: X-rays reviewed.  No evidence of glenohumeral osteoarthritis.

## 2021-03-05 ENCOUNTER — Other Ambulatory Visit: Payer: Self-pay

## 2021-03-05 ENCOUNTER — Ambulatory Visit: Payer: Self-pay

## 2021-03-05 ENCOUNTER — Ambulatory Visit (INDEPENDENT_AMBULATORY_CARE_PROVIDER_SITE_OTHER): Payer: Commercial Managed Care - PPO | Admitting: Sports Medicine

## 2021-03-05 VITALS — Ht 65.0 in | Wt 125.0 lb

## 2021-03-05 DIAGNOSIS — M25511 Pain in right shoulder: Secondary | ICD-10-CM

## 2021-03-05 DIAGNOSIS — G8929 Other chronic pain: Secondary | ICD-10-CM

## 2021-03-05 MED ORDER — METHYLPREDNISOLONE ACETATE 40 MG/ML IJ SUSP
40.0000 mg | Freq: Once | INTRAMUSCULAR | Status: AC
  Administered 2021-03-05: 40 mg via INTRA_ARTICULAR

## 2021-03-05 NOTE — Progress Notes (Addendum)
   Subjective:    Patient ID: Joy Bautista, female    DOB: 03-13-1966, 55 y.o.   MRN: [id number]  HPI  Joy Bautista presents today for a right shoulder ultrasound.  She was diagnosed with adhesive capsulitis during her last office visit.  She has been doing her home exercises but has not noticed any significant improvement.  Meloxicam has been minimally helpful as well.  In regards to her right carpal tunnel syndrome, she has noted significant improvement with night splinting.      Review of Systems As above    Objective:   Physical Exam  Developed, well nourished.  No acute distress  Right shoulder: Patient continues to have limited active and passive range of motion in all directions, especially internal rotation, external rotation, and abduction.  Positive empty can, positive Hawkins.  Rotator cuff strength is 5/5 but reproduces pain with resisted supraspinatus and infraspinatus.  No tenderness over the bicipital groove.  Ultrasound of the right shoulder performed today shows biceps tendon to be well visualized within the bicipital groove and unremarkable.  Subscap, supraspinatus, and infraspinatus were difficult to fully visualize due to the patient's limited range of motion.  There is some fluid within the subacromial bursa consistent with subacromial bursitis.  X-rays of the right shoulder including AP and axillary views show no significant degenerative changes.  Nothing acute.      Assessment & Plan:   Right shoulder pain secondary to subacromial bursitis versus occult rotator cuff tear versus adhesive capsulitis  Patient's right subacromial space was injected with cortisone today.  This was done atraumatically under sterile technique after risks and benefits were explained including the risk of transient hyperglycemia given her diabetes.  She tolerated this without difficulty.  She will continue with her range of motion exercises and will follow up with me again in 3 weeks.  If  symptoms do not improve with today's injection then we will consider an MRI to better evaluate the rotator cuff since today's ultrasound was inconclusive.  If her symptoms have improved at follow-up, consider adding Jobe exercises.  We also provided her with a work note today limiting her to lifting no greater than 10 pounds and avoiding repetitive overhead activity.  Call with questions or concerns in the interim.  Consent obtained and verified. Time-out conducted. Noted no overlying erythema, induration, or other signs of local infection. Skin prepped in a sterile fashion. Topical analgesic spray: Ethyl chloride. Joint: Right subacromial space Needle: 25-gauge 1.5 inch needle Completed without difficulty. Meds: 3 cc 1% Xylocaine, 1 cc (40 mg) Depo-Medrol  Advised to call if fevers/chills, erythema, induration, drainage, or persistent bleeding.

## 2021-03-11 ENCOUNTER — Other Ambulatory Visit: Payer: Self-pay | Admitting: Sports Medicine

## 2021-03-26 ENCOUNTER — Other Ambulatory Visit: Payer: Self-pay

## 2021-03-26 ENCOUNTER — Ambulatory Visit (INDEPENDENT_AMBULATORY_CARE_PROVIDER_SITE_OTHER): Payer: Commercial Managed Care - PPO | Admitting: Sports Medicine

## 2021-03-26 VITALS — Ht 65.0 in | Wt 125.0 lb

## 2021-03-26 DIAGNOSIS — M25511 Pain in right shoulder: Secondary | ICD-10-CM

## 2021-03-26 DIAGNOSIS — G8929 Other chronic pain: Secondary | ICD-10-CM | POA: Diagnosis not present

## 2021-03-26 MED ORDER — MELOXICAM 15 MG PO TABS: ORAL_TABLET | ORAL | 1 refills | Status: DC

## 2021-03-26 NOTE — Progress Notes (Signed)
   Subjective:    Patient ID: Joy Bautista, female    DOB: 07/07/1966, 55 y.o.   MRN: [id number]  HPI  Joy Bautista presents today for follow-up on right shoulder pain secondary to adhesive capsulitis versus occult rotator cuff tear.  Her pain did improve after a recent subacromial cortisone injection.  It has not completely resolved however.  She has also noticed an increase in her range of motion.  She has been diligent about doing her home exercises.  She has noticed that the meloxicam does not help her shoulder pain much but it does help her hand pain.  She is requesting a refill.    Review of Systems As above    Objective:   Physical Exam  Well-developed, well-nourished.  No acute distress  Right shoulder: Patient has active forward flexion to 150 degrees.  Abduction is still limited to 90 degrees actively.  Internal rotation is limited to about 60 degrees.  External rotation is also limited.  She continues to have good rotator cuff strength with resisted supraspinatus and infraspinatus.  She is neurovascularly intact distally.      Assessment & Plan:   Right shoulder pain secondary to adhesive capsulitis versus occult rotator cuff tear  Given Joy Bautista's overall improvement we are going to continue with our current plan of care.  I did discuss formal physical therapy but she would like to stick with her home exercises.  She has had adhesive capsulitis in her left shoulder previously and she understands that it may be many weeks or months before complete resolution.  Since she has noticed improvement over the past 4 weeks I am going to hold on MRI for now.  However, if her symptoms persist or worsen then I would reconsider this to rule out a rotator cuff tear (recent ultrasound was inconclusive).  I will also refill her meloxicam for her to continue to take as needed for her hand pain.  As long as her symptoms continue to improve she may follow-up with me as needed.  This note was dictated  using Dragon naturally speaking software and may contain errors in syntax, spelling, or content which have not been identified prior to signing this note.

## 2021-04-02 ENCOUNTER — Other Ambulatory Visit: Payer: Self-pay | Admitting: Family Medicine

## 2021-04-02 DIAGNOSIS — E119 Type 2 diabetes mellitus without complications: Secondary | ICD-10-CM

## 2021-05-01 ENCOUNTER — Other Ambulatory Visit: Payer: Self-pay

## 2021-05-01 MED ORDER — NAPROXEN 500 MG PO TABS: 500.0000 mg | ORAL_TABLET | Freq: Two times a day (BID) | ORAL | 0 refills | Status: DC | PRN

## 2021-05-10 ENCOUNTER — Ambulatory Visit: Payer: Commercial Managed Care - PPO | Admitting: Orthopedic Surgery

## 2021-05-28 ENCOUNTER — Other Ambulatory Visit: Payer: Self-pay | Admitting: Sports Medicine

## 2021-06-14 ENCOUNTER — Other Ambulatory Visit: Payer: Self-pay | Admitting: Family Medicine

## 2021-06-14 DIAGNOSIS — F339 Major depressive disorder, recurrent, unspecified: Secondary | ICD-10-CM

## 2021-06-14 DIAGNOSIS — F411 Generalized anxiety disorder: Secondary | ICD-10-CM

## 2021-06-20 ENCOUNTER — Other Ambulatory Visit: Payer: Self-pay | Admitting: Family Medicine

## 2021-06-20 DIAGNOSIS — E119 Type 2 diabetes mellitus without complications: Secondary | ICD-10-CM

## 2021-07-16 ENCOUNTER — Other Ambulatory Visit: Payer: Self-pay | Admitting: Sports Medicine

## 2021-09-05 ENCOUNTER — Encounter: Payer: Self-pay | Admitting: Family Medicine

## 2021-09-05 ENCOUNTER — Ambulatory Visit (INDEPENDENT_AMBULATORY_CARE_PROVIDER_SITE_OTHER): Payer: Commercial Managed Care - PPO | Admitting: Family Medicine

## 2021-09-05 ENCOUNTER — Other Ambulatory Visit: Payer: Self-pay

## 2021-09-05 VITALS — BP 112/70 | HR 77 | Ht 65.0 in | Wt 124.0 lb

## 2021-09-05 DIAGNOSIS — E119 Type 2 diabetes mellitus without complications: Secondary | ICD-10-CM

## 2021-09-05 DIAGNOSIS — E1169 Type 2 diabetes mellitus with other specified complication: Secondary | ICD-10-CM | POA: Diagnosis not present

## 2021-09-05 DIAGNOSIS — R351 Nocturia: Secondary | ICD-10-CM | POA: Diagnosis not present

## 2021-09-05 DIAGNOSIS — R32 Unspecified urinary incontinence: Secondary | ICD-10-CM | POA: Insufficient documentation

## 2021-09-05 DIAGNOSIS — F411 Generalized anxiety disorder: Secondary | ICD-10-CM

## 2021-09-05 DIAGNOSIS — E785 Hyperlipidemia, unspecified: Secondary | ICD-10-CM

## 2021-09-05 DIAGNOSIS — Z1231 Encounter for screening mammogram for malignant neoplasm of breast: Secondary | ICD-10-CM | POA: Diagnosis not present

## 2021-09-05 DIAGNOSIS — F339 Major depressive disorder, recurrent, unspecified: Secondary | ICD-10-CM

## 2021-09-05 LAB — POCT GLYCOSYLATED HEMOGLOBIN (HGB A1C): HbA1c, POC (controlled diabetic range): 7.1 % — AB (ref 0.0–7.0)

## 2021-09-05 LAB — POCT URINALYSIS DIP (MANUAL ENTRY)
Bilirubin, UA: NEGATIVE
Blood, UA: NEGATIVE
Glucose, UA: NEGATIVE mg/dL
Ketones, POC UA: NEGATIVE mg/dL
Nitrite, UA: NEGATIVE
Protein Ur, POC: NEGATIVE mg/dL
Spec Grav, UA: 1.02 (ref 1.010–1.025)
Urobilinogen, UA: 0.2 E.U./dL
pH, UA: 7 (ref 5.0–8.0)

## 2021-09-05 LAB — POCT UA - MICROALBUMIN
Creatinine, POC: 100 mg/dL
Microalbumin Ur, POC: 80 mg/L

## 2021-09-05 MED ORDER — VENLAFAXINE HCL ER 37.5 MG PO CP24: 37.5000 mg | ORAL_CAPSULE | Freq: Every day | ORAL | 1 refills | Status: DC

## 2021-09-05 NOTE — Assessment & Plan Note (Signed)
Improved. Patient desires to wean off medications. May DC buspar anytime, given she is on very low dose. For venlafaxine taper, recommend 37.5 mg daily x1 week then every other day x1 week. Follow up one week. Return precautions discussed. See AVS for more.  ?

## 2021-09-05 NOTE — Assessment & Plan Note (Signed)
Chronic. Unclear etiology, although considered UTI, medication side effect, pelvic floor dysfunction, and sleep disorder. UA today shows trace leukocytes, no clear evidence of infection. Will refer to urogynecology for evaluation and management.  ?

## 2021-09-05 NOTE — Assessment & Plan Note (Addendum)
A1c at goal, has been stable since Nov 2021. Tolerating meds well. No changes at this time. Next A1c 6 months. Referred to ophtho. Urine microalbumin collected.  ?

## 2021-09-05 NOTE — Progress Notes (Signed)
? ? ?SUBJECTIVE:  ? ?CHIEF COMPLAINT / HPI:  ? ?T2DM ?- A1c today 7.1, stable from previous values ?- current regimen: metformin XR 500 mg, tolerating well without GI s/e or hypoglycemic episodes ?Lab Results  ?Component Value Date  ? HGBA1C 7.1 (A) 09/05/2021  ? HGBA1C 7.0 02/05/2021  ? HGBA1C 6.9 (A) 05/08/2020  ? ?Lab Results  ?Component Value Date  ? MICROALBUR 80 06/22/2020  ? LDLCALC 168 (H) 05/18/2019  ? CREATININE 0.80 01/31/2020  ? ?Anxiety and depressed mood ?- patient reports vast improvement in symptoms ?- environmental changes have provided fewer triggers than before ?- she is interested in weaning the venlafaxine and buspar to see how she does ? ?Enuresis ?- duration about one year ?- nocturia and enuresis 4 of 7 nights/week, variable volume of urine ?- will wake up wet, does not always wake with sense of bladder urgency ?- feels like she empties bladder fully each time she urinates ?- does have stable daytime urge incontnience ?- no dysuria, suprapubic comfort, or other s/s of UTI ?- no prior trauma, new meds, or radiation ?- denies saddle anesthesia ? ?PERTINENT  PMH / PSH:  ?Patient Active Problem List  ? Diagnosis Date Noted  ? Enuresis 09/05/2021  ? Chronic right shoulder pain 02/06/2021  ? Vaginal irritation 06/22/2020  ? Headache, drug induced 06/08/2020  ? Breast cancer screening by mammogram 05/25/2020  ? Kidney stone 08/08/2019  ? Diabetes mellitus without complication (HCC) 04/13/2018  ? Tinnitus, bilateral 04/13/2018  ? Loud snoring 04/13/2018  ? Hyperlipidemia 04/13/2018  ? Cervical cancer screening 01/25/2014  ? Generalized anxiety disorder 07/10/2010  ? Depression, recurrent (HCC) 07/10/2010  ? HEARING LOSS, LEFT EAR 07/10/2010  ? ALLERGIC RHINITIS 07/10/2010  ? MENOPAUSAL SYNDROME 07/10/2010  ?  ?OBJECTIVE:  ? ?BP 112/70   Pulse 77   Ht 5\' 5"  (1.651 m)   Wt 124 lb (56.2 kg)   LMP 03/22/2016   SpO2 99%   BMI 20.63 kg/m?   ? ?PHQ-9:  ?Depression screen [facility] 2/9 09/05/2021 02/05/2021  06/22/2020  ?Decreased Interest 1 1 1   ?Down, Depressed, Hopeless 1 1 1   ?PHQ - 2 Score 2 2 2   ?Altered sleeping 0 1 0  ?Tired, decreased energy 0 1 1  ?Change in appetite 0 0 0  ?Feeling bad or failure about yourself  0 0 0  ?Trouble concentrating 1 0 1  ?Moving slowly or fidgety/restless 0 0 1  ?Suicidal thoughts 0 0 0  ?PHQ-9 Score 3 4 5   ?Difficult doing work/chores - Very difficult Somewhat difficult  ?Some recent data might be hidden  ?  ?GAD-7:  ?GAD 7 : Generalized Anxiety Score 06/22/2020 06/08/2020 05/25/2020 05/08/2020  ?Nervous, Anxious, on Edge 1 1 2 3   ?Control/stop worrying 0 0 1 1  ?Worry too much - different things 0 0 2 1  ?Trouble relaxing 1 1 2 3   ?Restless 0 1 1 3   ?Easily annoyed or irritable 1 1 3 3   ?Afraid - awful might happen 0 0 0 1  ?Total GAD 7 Score 3 4 11 15   ?Anxiety Difficulty Somewhat difficult Somewhat difficult - Very difficult  ? ?Physical Exam ?General: Awake, alert, oriented ?Cardiovascular: Regular rate and rhythm, S1 and S2 present, no murmurs auscultated ?Respiratory: Lung fields clear to auscultation bilaterally ?Extremities: No bilateral lower extremity edema, palpable pedal and pretibial pulses bilaterally ?Neuro: Cranial nerves II through X grossly intact, able to move all extremities spontaneously ? ?ASSESSMENT/PLAN:  ? ?Generalized anxiety disorder ?Improved.  Patient desires to wean off medications. May DC buspar anytime, given she is on very low dose. For venlafaxine taper, recommend 37.5 mg daily x1 week then every other day x1 week. Follow up one week. Return precautions discussed. See AVS for more.  ? ?Diabetes mellitus without complication (HCC) ?A1c at goal, has been stable since Nov 2021. Tolerating meds well. No changes at this time. Next A1c 6 months.  ? ?Enuresis ?Chronic. Unclear etiology, although considered UTI, medication side effect, pelvic floor dysfunction, and sleep disorder. UA today shows trace leukocytes, no clear evidence of infection. Will refer to  urogynecology for evaluation and management.  ?  ? ? ?[name], MD ?[facility]  ?

## 2021-09-05 NOTE — Patient Instructions (Addendum)
It was wonderful to see you today. Thank you for allowing me to be a part of your care. Below is a short summary of what we discussed at your visit today: ? ?Diabetes ?Your A1c today is 7.1, stable from last value of 7.0 on 02/05/2021. ?Keep taking your metformin every day.  ?No changes to your medicines at this time.  ?Today I referred you to the ophthalmologist  ? ?Cholesterol ?Today we took blood to check your cholesterol. If the results are normal, I will send you a letter or MyChart message. If the results are abnormal, I will give you a call.   ? ?Anxiety meds ?Today we talked about weaning off of buspar and venlafaxine.  ?Venlafaxine:  ?Week 1 - Take the half dose 37.5 mg every day.  ?Week 2 - Take 37.5 mg every other day.  ?Week 3 - Off ?Buspar: ? Week 1 - Take one tablet eery other day ? Week 2 - Off ?Come back in 1 week for follow up! ? ?Mammogram ?I have ordered your routine mammogram to screen for breast cancer. This will be at the [facility]. You will call them directly to make an appointment at your convenience. Information below. ? ?  ? ?Please bring all of your medications to every appointment! ? ?If you have any questions or concerns, please do not hesitate to contact us via phone or MyChart message.  ? ?[name], MD  ?

## 2021-09-06 ENCOUNTER — Encounter: Payer: Self-pay | Admitting: Family Medicine

## 2021-09-06 LAB — LIPID PANEL
Chol/HDL Ratio: 3.3 ratio (ref 0.0–4.4)
Cholesterol, Total: 231 mg/dL — ABNORMAL HIGH (ref 100–199)
HDL: 69 mg/dL (ref >39)
LDL Chol Calc (NIH): 142 mg/dL — ABNORMAL HIGH (ref 0–99)
Triglycerides: 115 mg/dL (ref 0–149)
VLDL Cholesterol Cal: 20 mg/dL (ref 5–40)

## 2021-09-06 LAB — BASIC METABOLIC PANEL
BUN/Creatinine Ratio: 13 (ref 9–23)
BUN: 13 mg/dL (ref 6–24)
CO2: 27 mmol/L (ref 20–29)
Calcium: 10.7 mg/dL — ABNORMAL HIGH (ref 8.7–10.2)
Chloride: 112 mmol/L — ABNORMAL HIGH (ref 96–106)
Creatinine, Ser: 0.97 mg/dL (ref 0.57–1.00)
Glucose: 152 mg/dL — ABNORMAL HIGH (ref 70–99)
Potassium: 5.9 mmol/L — ABNORMAL HIGH (ref 3.5–5.2)
Sodium: 150 mmol/L — ABNORMAL HIGH (ref 134–144)
eGFR: 68 mL/min/{1.73_m2} (ref >59)

## 2021-09-10 ENCOUNTER — Other Ambulatory Visit: Payer: Self-pay | Admitting: Family Medicine

## 2021-09-10 ENCOUNTER — Other Ambulatory Visit (HOSPITAL_COMMUNITY): Payer: Self-pay

## 2021-09-10 DIAGNOSIS — E119 Type 2 diabetes mellitus without complications: Secondary | ICD-10-CM

## 2021-09-10 IMAGING — DX DG SHOULDER 2+V*R*
3 series · 3 of 3 positions shown · non-contrast
Comparison: None.

CLINICAL DATA: Limited range of motion

EXAM:
RIGHT SHOULDER - 2+ VIEW

[dg shoulder right (1 of 3)]
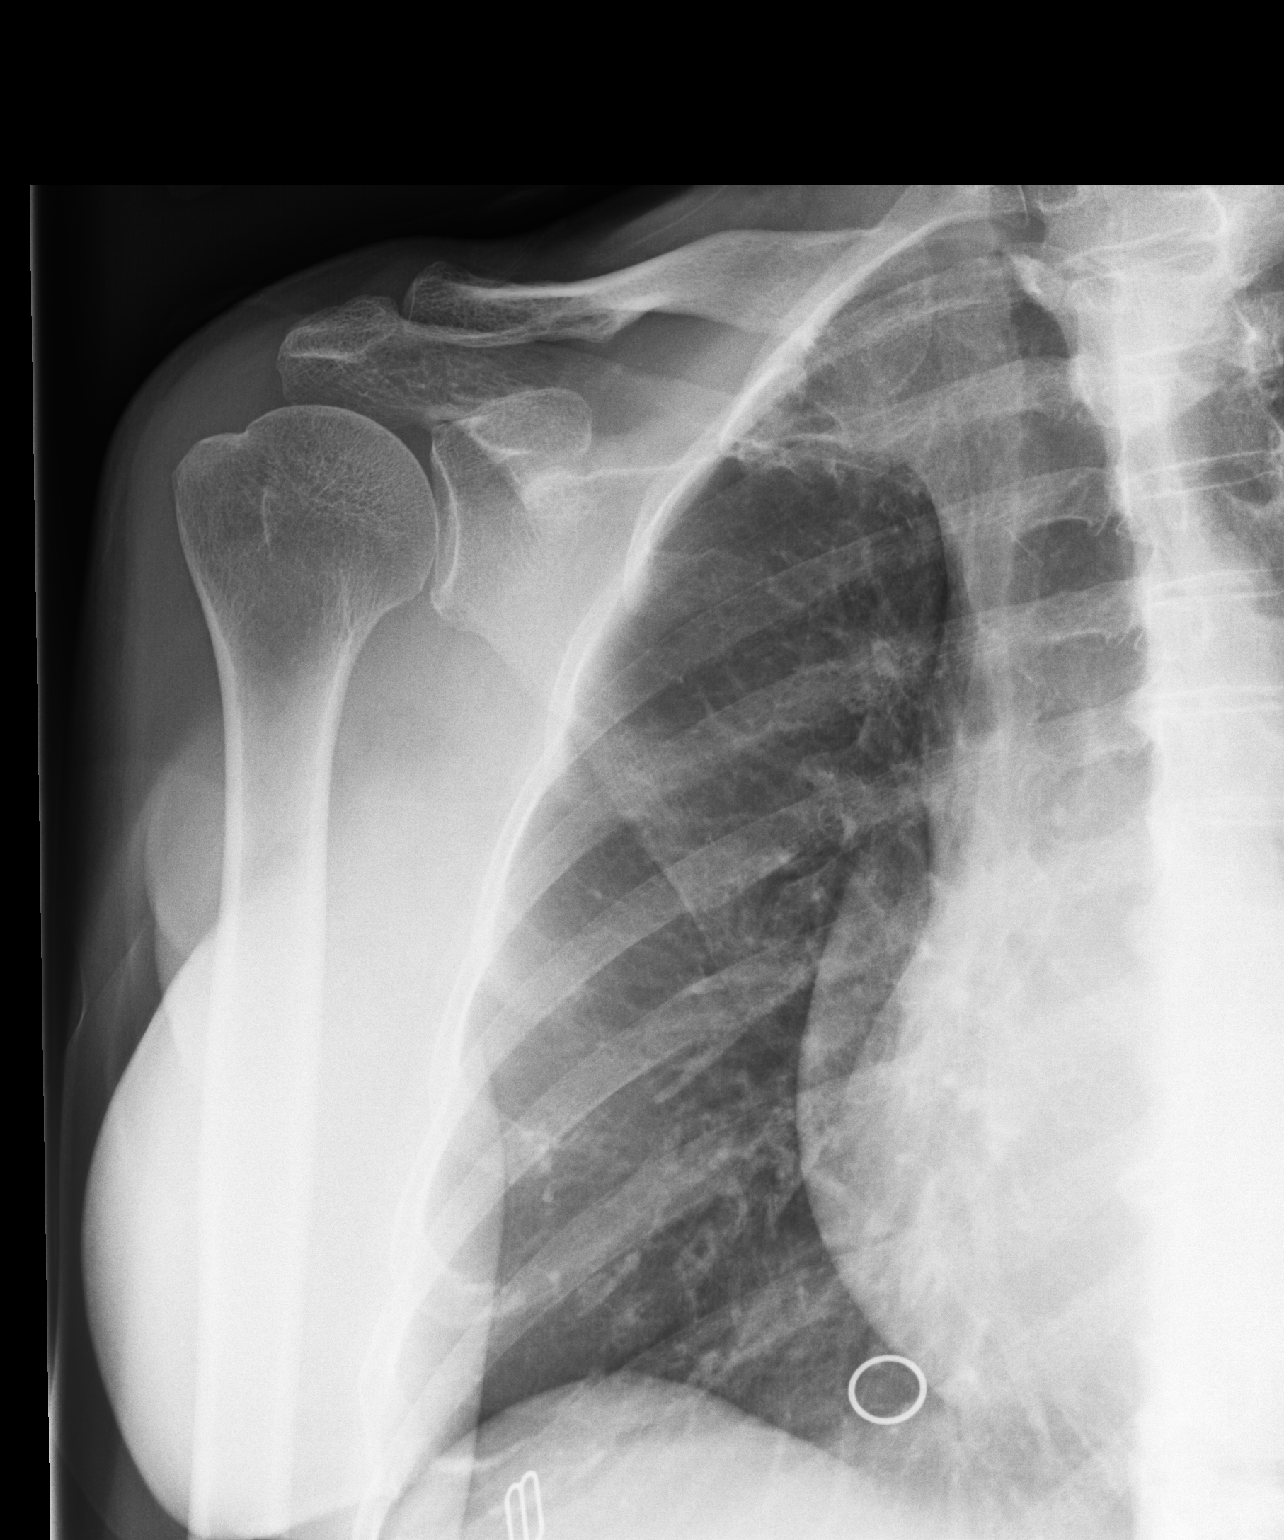

[dg shoulder right (2 of 3)]
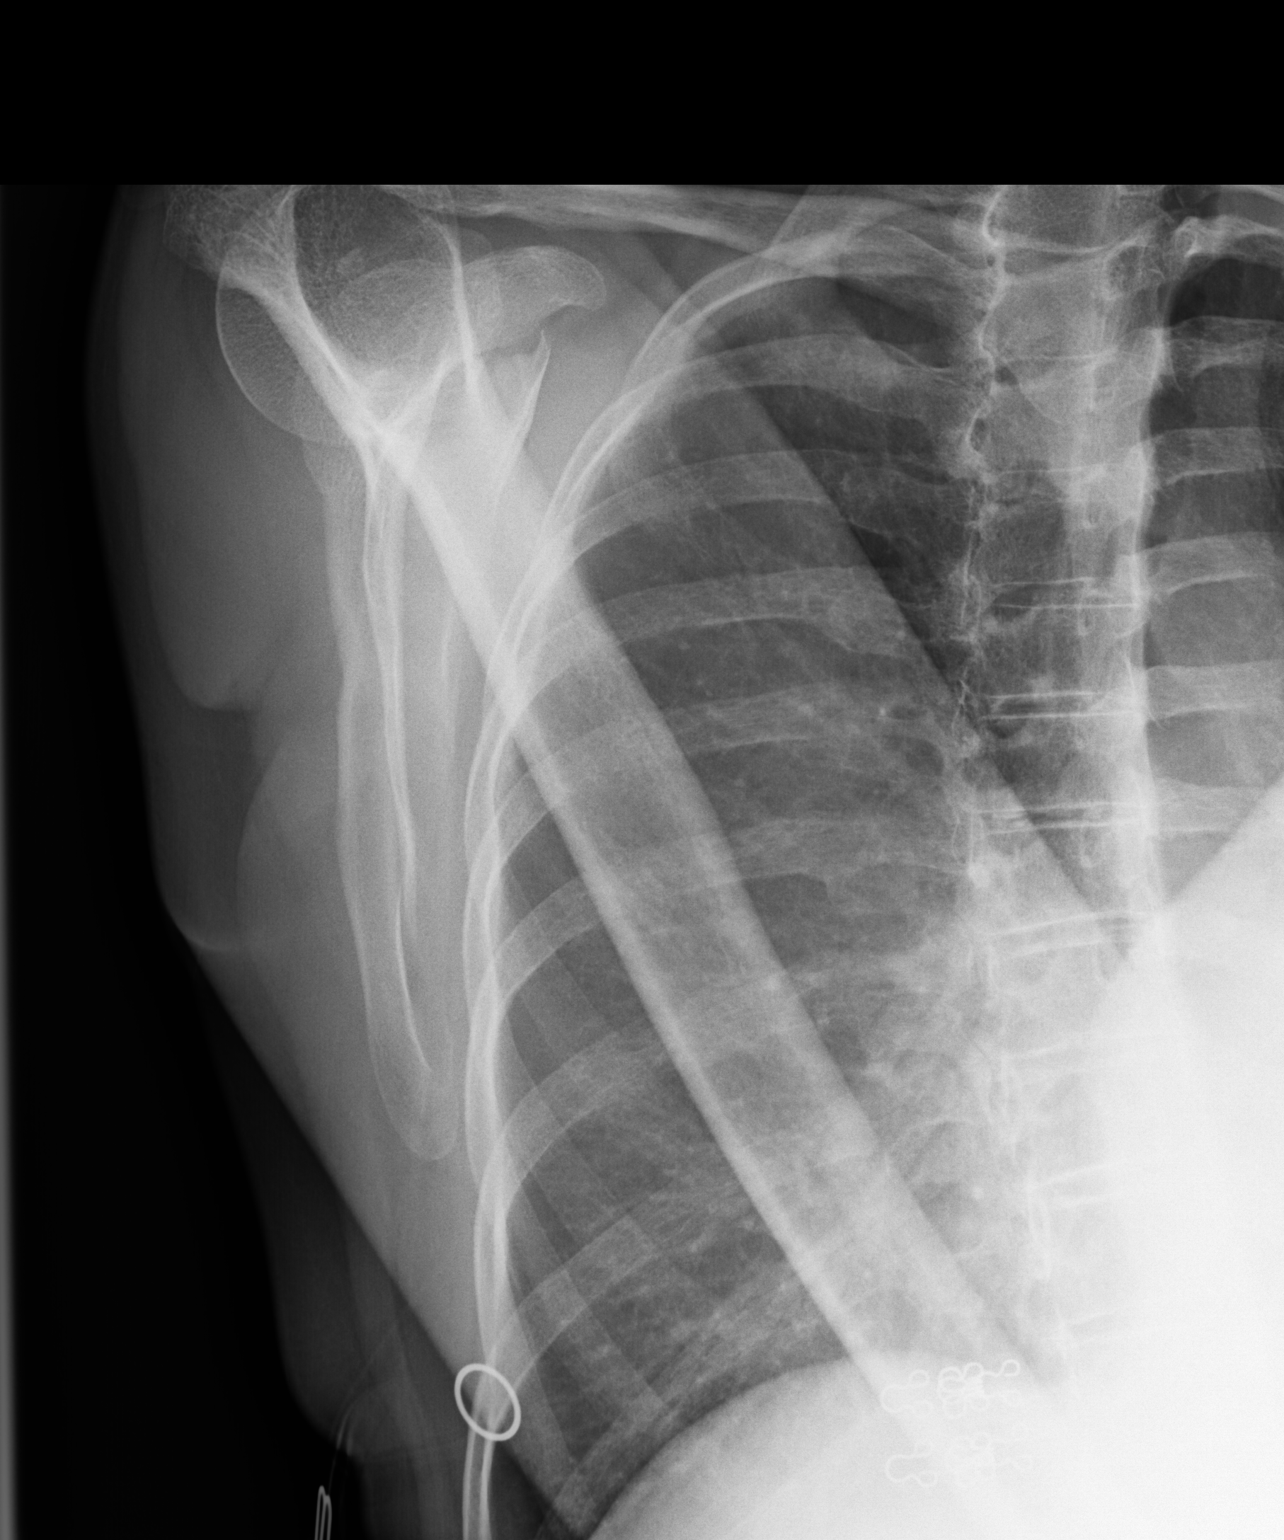

[dg shoulder right (3 of 3)]
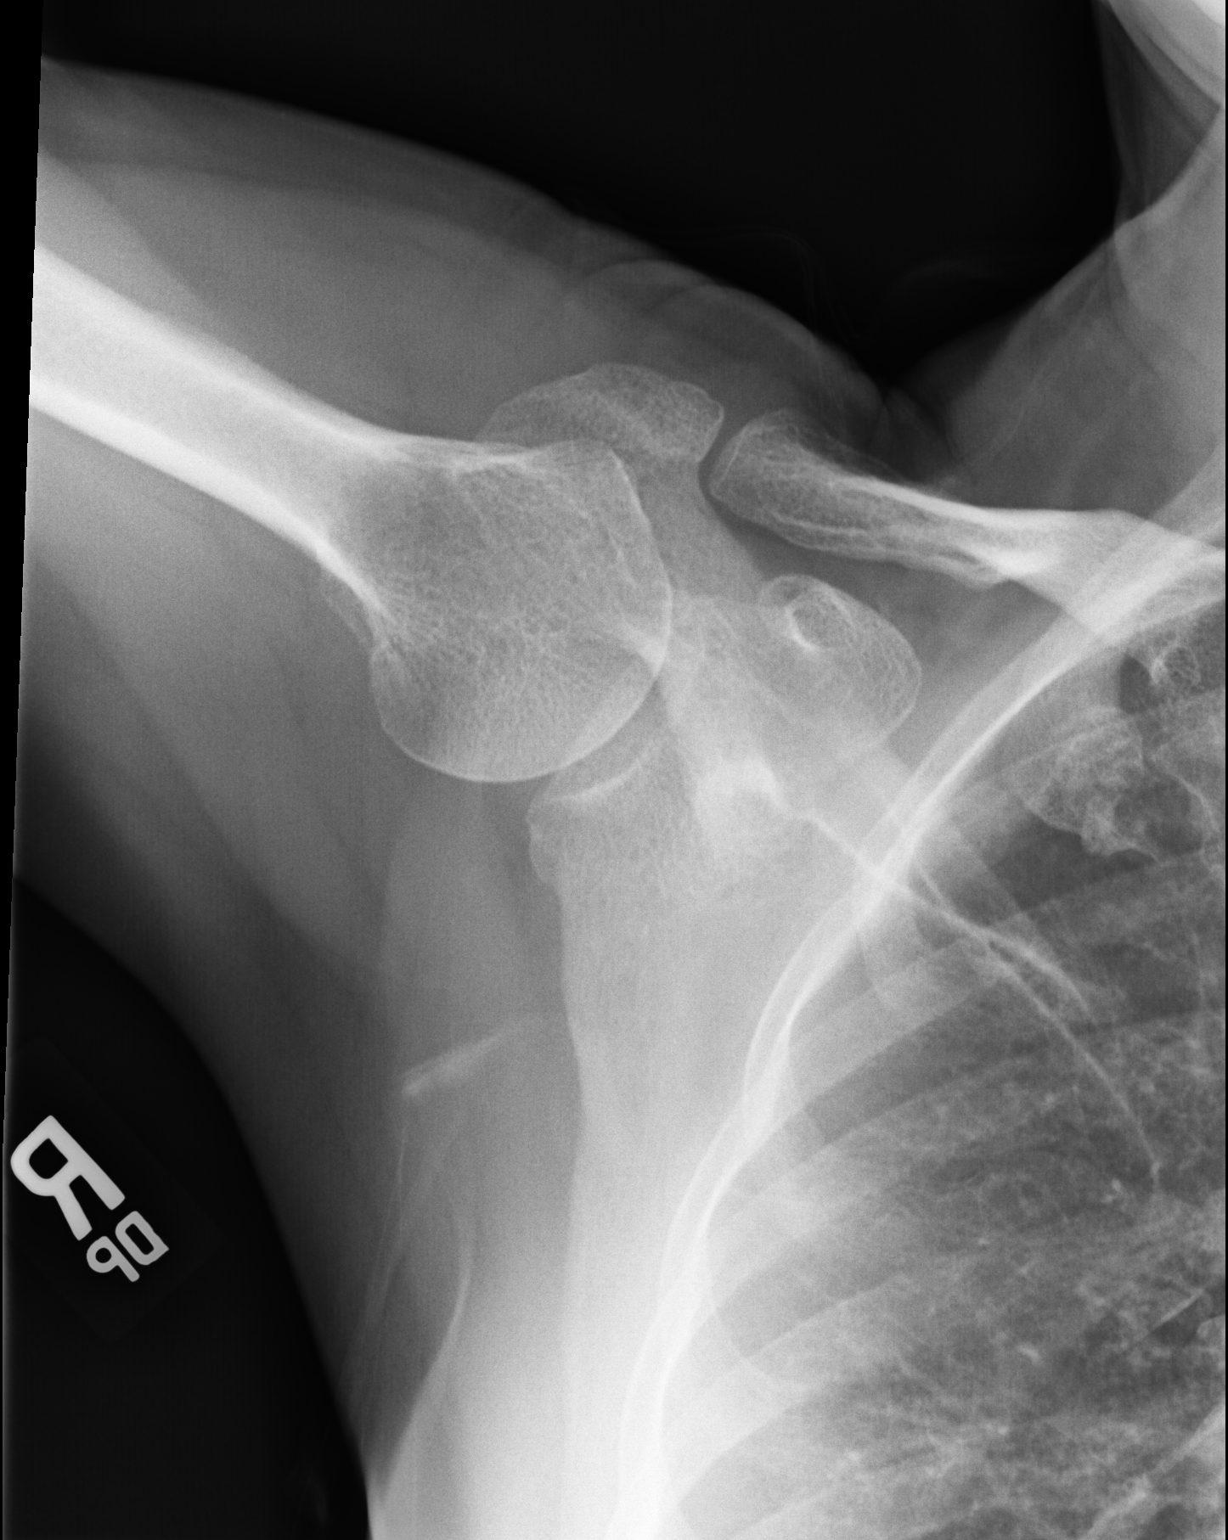

[3 of 3 positions shown; findings below may reference images not displayed]

FINDINGS: There is no evidence of fracture or dislocation. There is no
evidence of arthropathy or other focal bone abnormality. Soft
tissues are unremarkable.
IMPRESSION: Negative.

## 2021-09-10 MED ORDER — METFORMIN HCL ER 500 MG PO TB24
500.0000 mg | ORAL_TABLET | Freq: Every day | ORAL | 3 refills | Status: DC
  Filled 2021-09-10: qty 90, 90d supply, fill #0

## 2021-09-11 ENCOUNTER — Telehealth: Payer: Self-pay | Admitting: Family Medicine

## 2021-09-11 DIAGNOSIS — E875 Hyperkalemia: Secondary | ICD-10-CM

## 2021-09-11 DIAGNOSIS — E878 Other disorders of electrolyte and fluid balance, not elsewhere classified: Secondary | ICD-10-CM

## 2021-09-11 DIAGNOSIS — E870 Hyperosmolality and hypernatremia: Secondary | ICD-10-CM

## 2021-09-11 NOTE — Telephone Encounter (Signed)
Called to discuss lab results, suspect BMP erroneous given multiple elevated values.  ? ?No answer, left HIPAA safe VM. ? ?Would like her to come back and repeat. I have ordered a future CMP for her to come into lab and complete when able.  ? ?[name], MD ? ?

## 2021-09-11 NOTE — Telephone Encounter (Signed)
Patient returns call to nurse line. Scheduled patient for repeat labs on Monday, 3/13. ? ?Veronda Prude, RN ? ?

## 2021-09-12 ENCOUNTER — Other Ambulatory Visit (HOSPITAL_COMMUNITY): Payer: Self-pay

## 2021-09-12 ENCOUNTER — Encounter: Payer: Self-pay | Admitting: Family Medicine

## 2021-09-16 ENCOUNTER — Other Ambulatory Visit: Payer: Commercial Managed Care - PPO

## 2021-09-16 ENCOUNTER — Other Ambulatory Visit: Payer: Self-pay

## 2021-09-16 DIAGNOSIS — E878 Other disorders of electrolyte and fluid balance, not elsewhere classified: Secondary | ICD-10-CM

## 2021-09-16 DIAGNOSIS — E870 Hyperosmolality and hypernatremia: Secondary | ICD-10-CM

## 2021-09-16 DIAGNOSIS — E875 Hyperkalemia: Secondary | ICD-10-CM

## 2021-09-17 LAB — COMPREHENSIVE METABOLIC PANEL
ALT: 21 IU/L (ref 0–32)
AST: 19 IU/L (ref 0–40)
Albumin/Globulin Ratio: 2.3 — ABNORMAL HIGH (ref 1.2–2.2)
Albumin: 4.6 g/dL (ref 3.8–4.9)
Alkaline Phosphatase: 68 IU/L (ref 44–121)
BUN/Creatinine Ratio: 17 (ref 9–23)
BUN: 13 mg/dL (ref 6–24)
Bilirubin Total: 0.4 mg/dL (ref 0.0–1.2)
CO2: 24 mmol/L (ref 20–29)
Calcium: 9.8 mg/dL (ref 8.7–10.2)
Chloride: 112 mmol/L — ABNORMAL HIGH (ref 96–106)
Creatinine, Ser: 0.78 mg/dL (ref 0.57–1.00)
Globulin, Total: 2 g/dL (ref 1.5–4.5)
Glucose: 163 mg/dL — ABNORMAL HIGH (ref 70–99)
Potassium: 4.7 mmol/L (ref 3.5–5.2)
Sodium: 149 mmol/L — ABNORMAL HIGH (ref 134–144)
Total Protein: 6.6 g/dL (ref 6.0–8.5)
eGFR: 88 mL/min/{1.73_m2} (ref >59)

## 2021-09-25 ENCOUNTER — Encounter: Payer: Self-pay | Admitting: Family Medicine

## 2021-09-25 ENCOUNTER — Telehealth: Payer: Self-pay | Admitting: Family Medicine

## 2021-09-25 NOTE — Progress Notes (Addendum)
Abnormal labs ? ?At last appointment 09/05/21, collected BMP. Several markers were abnormal, so we repeated 09/16/21. The repeated lab still demonstrated hypernatremia and hyperchloremia (however K and Ca normalized). Albumin corrected anion gap of 11.5 on 3/13. ? ?Calculated anion gaps below: ?11 (09/05/21), 12 (01/31/20 - in ED w/ kidney stone), 15 (08/08/19 [facility] w/ kidney stone w/u), 16 (05/18/19 - [facility] for well woman exam), 15 (01/11/18 - [facility] for chest pain and anxiety), 8 (08/09/15 - [facility] for well woman) ? ?Previous med list:  ? Buspirone 5 ? Meloxicam 15 ? Metformin 500 XR daily ? Naproxen ? Venlafaxine 37.5 XR ? ?Currently, however, patient is only taking metformin 500 mg XR tablet. We recently weaned her off buspirone and venlafaxine. She reported not taking meloxicam and naproxen on 09/05/2021.  ? ?Her chart reflects metformin initiated 06/16/2019. ? ? ? ?Will call patient and have her come in for labs in early April: repeat BMP, urine osm, urine sodium ? ?[name], MD ? ?

## 2021-09-25 NOTE — Addendum Note (Signed)
Addended by: Renard Hamper on: 09/25/2021 03:13 PM ? ? Modules accepted: Orders ? ?

## 2021-09-25 NOTE — Addendum Note (Signed)
Addended by: Valetta Close on: 09/25/2021 03:34 PM ? ? Modules accepted: Orders ? ?

## 2021-09-25 NOTE — Telephone Encounter (Signed)
Called patient to discuss labs. No answer. Chronic hypernatremia and hyperchloremia go back to 2015. Given stability and no symptoms, doubt underlying disorder. Possibly under hydration issue.  ? ?Recommend patient return in early April for repeat BMP, urine osm, and urine sodium studies.  ? ?If abnormal, can send to either nephro or endocrine.  ? ?[name], MD ? ?

## 2021-10-03 ENCOUNTER — Encounter: Payer: Self-pay | Admitting: Family Medicine

## 2021-10-09 ENCOUNTER — Encounter: Payer: Self-pay | Admitting: Family Medicine

## 2021-10-09 ENCOUNTER — Ambulatory Visit (INDEPENDENT_AMBULATORY_CARE_PROVIDER_SITE_OTHER): Payer: Commercial Managed Care - PPO | Admitting: Family Medicine

## 2021-10-09 VITALS — BP 112/70 | HR 78 | Ht 65.0 in | Wt 124.8 lb

## 2021-10-09 DIAGNOSIS — R102 Pelvic and perineal pain: Secondary | ICD-10-CM

## 2021-10-09 DIAGNOSIS — R1024 Suprapubic pain: Secondary | ICD-10-CM

## 2021-10-09 DIAGNOSIS — R1013 Epigastric pain: Secondary | ICD-10-CM | POA: Diagnosis not present

## 2021-10-09 DIAGNOSIS — R109 Unspecified abdominal pain: Secondary | ICD-10-CM

## 2021-10-09 MED ORDER — POLYETHYLENE GLYCOL 3350 17 GM/SCOOP PO POWD: 17.0000 g | Freq: Two times a day (BID) | ORAL | 1 refills | Status: AC | PRN

## 2021-10-09 NOTE — Progress Notes (Deleted)
? ? ?  SUBJECTIVE:  ? ?CHIEF COMPLAINT / HPI:  ? ?*** ? ?PERTINENT  PMH / PSH: Kidney stone, T2DM, HLD, enuresis, GAD, depressed mood ? ?OBJECTIVE:  ? ?BP 112/70   Pulse 78   Ht 5\' 5"  (1.651 m)   Wt 124 lb 12.8 oz (56.6 kg)   LMP 03/22/2016   SpO2 100%   BMI 20.77 kg/m?   ?*** ? ?ASSESSMENT/PLAN:  ? ?No problem-specific Assessment & Plan notes found for this encounter. ?  ?Urine culture ?Kidney stones? ?Renal 03/24/2016? ? ?Postprandial epigastric discomfort ?RUQ [place]? ? ? ?[place], MD ?[facility]  ?

## 2021-10-09 NOTE — Patient Instructions (Addendum)
It was wonderful to see you today. Thank you for allowing me to be a part of your care. Below is a short summary of what we discussed at your visit today: ? ?Epigastric pain after eating ?I will order an ultrasound of the area to evaluate for gallstones. The nurse will call you tomorrow with the date and time.  ? ?Lower abdominal pain ?I will send your urine off for culture.  ?Come back in 2-3 weeks for a repeat urine test. If there is still blood in your urine, we will get a kidney ultrasound.  ?Keep a diary of when your symptoms happen so we can look at any patterns.  ? ?Constipation ?Take miralax one scoop daily. If you still aren't having soft stools, increase to twice daily.  ? ? ?Please bring all of your medications to every appointment! ? ?If you have any questions or concerns, please do not hesitate to contact us via phone or MyChart message.  ? ?[name], MD  ?

## 2021-10-09 NOTE — Progress Notes (Cosign Needed)
? ? ?  SUBJECTIVE:  ? ?CHIEF COMPLAINT / HPI: Episode of epigastric pain, constant suprapubic and lower back pain, constipation.  ? ?PERTINENT  PMH / PSH:  ? ?Joy Bautista is a 56 year old female with diabetes, GAD, and a history of kidney stones who presents today with a 2 week history of suprapubic pain which she describes as a pressure. The pain radiates to her lower back. She denies pain with urination, but endorses some occasional burning. She denies increases in frequency or urgency of urination, and states that her enuresis continues, but she believes it has improved. She occasionally has stress incontinence with sneezing or coughing, but it is infrequent. She states that this feels different than the kidney stones she has had in the past.  ? ?She describes intermittent episodes of epigastric pain and pressure. She presents today because of one episode that occurred last week, when she ate two hot dogs, fries, and drank a diet pepsi, and then while driving home, felt intense epigastric pressure, nausea, and became hot and sweaty. She returned home and the pain self-resolved in 20 minutes with rest. She has had episodes like this in the past after eating, but never this severe. With her past episodes, antacids do not help.  ? ?She describes continues constipation and wonders if that contributes to her abdominal pain. She describes RUQ and LLQ pain, and reports some diarrhea with laxative use. She has recently made drastic changes in diet due to a diabetes class, which she states may be contributing to her bowel changes.  ? ? ? ?OBJECTIVE:  ? ?BP 112/70   Pulse 78   Ht 5\' 5"  (1.651 m)   Wt 124 lb 12.8 oz (56.6 kg)   LMP 03/22/2016   SpO2 100%   BMI 20.77 kg/m?   ? ?Abdomen: normoactive bowel sounds, soft, non-distended. Tenderness to light palpation in the suprapubic and epigastric areas, as well as the RLQ.  ? ?ASSESSMENT/PLAN:  ? ?No problem-specific Assessment & Plan notes found for this encounter. ?  ?Urine  culture ?Kidney stones? ?Renal 03/24/2016? ? ?Postprandial epigastric discomfort ?RUQ [place]? ? ? ?[place], Medical Student ?[facility]  ?

## 2021-10-10 ENCOUNTER — Encounter: Payer: Self-pay | Admitting: Family Medicine

## 2021-10-10 DIAGNOSIS — R102 Pelvic and perineal pain: Secondary | ICD-10-CM | POA: Insufficient documentation

## 2021-10-10 DIAGNOSIS — R1013 Epigastric pain: Secondary | ICD-10-CM | POA: Insufficient documentation

## 2021-10-10 LAB — URINALYSIS, MICROSCOPIC ONLY
Casts: NONE SEEN /lpf
RBC, Urine: NONE SEEN /hpf (ref 0–2)
WBC, UA: >30 /hpf — AB (ref 0–5)

## 2021-10-10 LAB — POCT URINALYSIS DIP (MANUAL ENTRY)
Bilirubin, UA: NEGATIVE
Glucose, UA: NEGATIVE mg/dL
Ketones, POC UA: NEGATIVE mg/dL
Nitrite, UA: NEGATIVE
Protein Ur, POC: NEGATIVE mg/dL
Spec Grav, UA: 1.015 (ref 1.010–1.025)
Urobilinogen, UA: 0.2 E.U./dL
pH, UA: 5.5 (ref 5.0–8.0)

## 2021-10-10 NOTE — Progress Notes (Signed)
? ? ?SUBJECTIVE:  ? ?CHIEF COMPLAINT / HPI: Episode of epigastric pain, constant suprapubic and lower back pain, constipation.  ? ?PERTINENT  PMH / PSH:  ? ?Joy Bautista is a 56 year old female with diabetes, GAD, and a history of kidney stones who presents today with a 2 week history of suprapubic pain which she describes as a pressure. The pain radiates to her lower back. She denies pain with urination, but endorses some occasional burning. She denies increases in frequency or urgency of urination, and states that her enuresis continues, but she believes it has improved. She occasionally has stress incontinence with sneezing or coughing, but it is infrequent. She states that this feels different than the kidney stones she has had in the past.  ? ?She describes intermittent episodes of epigastric pain and pressure. She presents today because of one episode that occurred last week, when she ate two hot dogs, fries, and drank a diet pepsi, and then while driving home, felt intense epigastric pressure, nausea, and became hot and sweaty. She returned home and the pain self-resolved in 20 minutes with rest. She has had episodes like this in the past after eating, but never this severe. With her past episodes, antacids do not help.  ? ?She describes continues constipation and wonders if that contributes to her abdominal pain. She describes RUQ and LLQ pain, and reports some diarrhea with laxative use. She has recently made drastic changes in diet due to a diabetes class, which she states may be contributing to her bowel changes.  ? ?OBJECTIVE:  ? ?BP 112/70   Pulse 78   Ht 5\' 5"  (1.651 m)   Wt 124 lb 12.8 oz (56.6 kg)   LMP 03/22/2016   SpO2 100%   BMI 20.77 kg/m?   ? ?PHQ-9:  ? ?  10/09/2021  ?  4:04 PM 09/05/2021  ?  8:34 AM 02/05/2021  ?  4:14 PM  ?Depression screen PHQ 2/9  ?Decreased Interest 0 1 1  ?Down, Depressed, Hopeless 0 1 1  ?PHQ - 2 Score 0 2 2  ?Altered sleeping 0 0 1  ?Tired, decreased energy 0 0 1  ?Change in  appetite 0 0 0  ?Feeling bad or failure about yourself  0 0 0  ?Trouble concentrating 0 1 0  ?Moving slowly or fidgety/restless 0 0 0  ?Suicidal thoughts 0 0 0  ?PHQ-9 Score 0 3 4  ?Difficult doing work/chores Not difficult at all  Very difficult  ? ?Physical Exam ?General: Awake, alert, oriented ?Cardiovascular: Regular rate and rhythm, S1 and S2 present, no murmurs auscultated ?Respiratory: Lung fields clear to auscultation bilaterally ?Abdomen: normoactive bowel sounds, soft, non-distended. Tenderness to light palpation in the suprapubic and epigastric areas, as well as the RLQ. No Murphy sign, but verbalized tenderness to RUQ palpation. No CVA tenderness. No rebound tenderness or guarding.  ? ?ASSESSMENT/PLAN:  ? ?Epigastric pain ?Subacute, intermittent. Differential includes acid reflux, stomach/duodenal ulcer, gallstone, gastroparesis, and other biliary etiologies. Given timing and associated foods, will order RUQ [place] to r/o gall stone and other biliary etiologies. If negative, will trial PPI vs H2 blocker for acid reflux or ulcer. Return precautions given, see AVS for more.  ? ?Suprapubic pain ?Subacute, intermittent, worsening. Considered UTI, pyelonephritis, renal calculi, IC, pelvic floor dysfunction. UA positive only for blood and leukocytes, not consistent with UTI. Will send urine for culture. Pyelo less likely given UA and lack of CVA tenderness or systemic symptoms such as fever and chills. IC a  possibility but cannot be diagnosed yet given presence of hematuria. Will have patient return in 2 weeks for repeat UA; if blood still present in urine, will order renal+bladder US to evaluate for kidney stones. Previously referred to urogynecology one month ago; has June appointment, but is on cancellation list in case she is able to get in sooner.  ?  ?Annia Belt, Medical Student ?[facility]  ? ?Upper Level Addendum: ?I have seen and evaluated this patient and reviewed the above note,  making necessary revisions as appropriate. I agree with the medical decision making and physical exam as noted above. ?Ezequiel Essex, MD ?PGY-2 ?[facility] ? ?

## 2021-10-10 NOTE — Assessment & Plan Note (Addendum)
Subacute, intermittent. Differential includes acid reflux, stomach/duodenal ulcer, gallstone, gastroparesis, and other biliary etiologies. Given timing and associated foods, will order RUQ [place] to r/o gall stone and other biliary etiologies. If negative, will trial PPI vs H2 blocker for acid reflux or ulcer. Return precautions given, see AVS for more.  ?

## 2021-10-10 NOTE — Assessment & Plan Note (Signed)
Subacute, intermittent, worsening. Considered UTI, pyelonephritis, renal calculi, IC, pelvic floor dysfunction. UA positive only for blood and leukocytes, not consistent with UTI. Will send urine for culture. Pyelo less likely given UA and lack of CVA tenderness or systemic symptoms such as fever and chills. IC a possibility but cannot be diagnosed yet given presence of hematuria. Will have patient return in 2 weeks for repeat UA; if blood still present in urine, will order renal+bladder US to evaluate for kidney stones. Previously referred to urogynecology one month ago; has June appointment, but is on cancellation list in case she is able to get in sooner.  ?

## 2021-10-11 LAB — URINE CULTURE

## 2021-10-14 ENCOUNTER — Telehealth: Payer: Self-pay | Admitting: *Deleted

## 2021-10-14 NOTE — Telephone Encounter (Signed)
THis has been completed in another encounter. Joy Bautista, CMA ? ?

## 2021-10-14 NOTE — Telephone Encounter (Signed)
LVM for pt to call office back to inform her of her [place] appointment time and date.  It is scheduled at [facility] on Monday 10/21/21, she is to arrive at 9:00am for a 9:30am appointment.  Nothing to eat or drink after midnight. She will go to first floor radiology at [facility] to check in. Will also send a MyCHart message informing her of this as well. Joy Bautista, CMA ? ?

## 2021-10-14 NOTE — Telephone Encounter (Signed)
-----   Message from [name], MD sent at 10/10/2021 12:11 PM EDT ----- ?Regarding: RUQ [place] ?I almost forgot to tell you about ordering this patient a RUQ [place]. Just wanted to let you know so we can schedule! ? ?Joy Bautista ? ?

## 2021-10-18 ENCOUNTER — Encounter: Payer: Self-pay | Admitting: Family Medicine

## 2021-10-18 ENCOUNTER — Ambulatory Visit: Payer: Commercial Managed Care - PPO | Admitting: Family Medicine

## 2021-10-18 DIAGNOSIS — Z599 Problem related to housing and economic circumstances, unspecified: Secondary | ICD-10-CM

## 2021-10-21 ENCOUNTER — Telehealth: Payer: Self-pay | Admitting: *Deleted

## 2021-10-21 ENCOUNTER — Encounter (HOSPITAL_COMMUNITY): Payer: Self-pay

## 2021-10-21 ENCOUNTER — Ambulatory Visit (HOSPITAL_COMMUNITY): Payer: Commercial Managed Care - PPO

## 2021-10-21 NOTE — Telephone Encounter (Signed)
? ?  Telephone encounter was:  Unsuccessful.  10/21/2021 ?Name: Joy Bautista MRN: [id number] DOB: May 01, 1966 ? ?Unsuccessful outbound call made today to assist with:  Financial Difficulties related to copay for ultrasound ? ?Outreach Attempt:  1st Attempt ? ?A HIPAA compliant voice message was left requesting a return call.  Instructed patient to call back at   Instructed patient to call back at [phone]  at their earliest convenience.  ?Alois Cliche -Berneda Rose ?Care Guide , Embedded Care Coordination ?[place], Care Management  ?[phone] ?[address] [place] , [place] [address] ?Email : [email or web address] ?  ?

## 2021-10-25 ENCOUNTER — Telehealth: Payer: Self-pay | Admitting: *Deleted

## 2021-10-28 ENCOUNTER — Telehealth: Payer: Self-pay | Admitting: *Deleted

## 2021-10-28 NOTE — Telephone Encounter (Signed)
? ?  Telephone encounter was:  Unsuccessful.  10/28/2021 ?Name: Joy Bautista MRN: [id number] DOB: Sep 23, 1965 ? ?Unsuccessful outbound call made today to assist with:   copay for ultrasound ? ?Outreach Attempt:  2nd Attempt ? ?A HIPAA compliant voice message was left requesting a return call.  Instructed patient to call back at   Instructed patient to call back at [phone]  at their earliest convenience. . ? ?Kymia Simi Greenauer -Berneda Rose ?Care Guide , Embedded Care Coordination ?[place], Care Management  ?[phone] ?[address] [place] , [place] [address] ?Email : [email or web address] ?  ?

## 2021-10-31 ENCOUNTER — Telehealth: Payer: Self-pay | Admitting: *Deleted

## 2021-10-31 NOTE — Telephone Encounter (Signed)
? ?  Telephone encounter was:  Unsuccessful.  10/31/2021 ?Name: Joy Bautista MRN: [id number] DOB: 1966/02/07 ? ?Unsuccessful outbound call made today to assist with:  Financial Difficulties related to copay for ultra sound  ? ?Outreach Attempt:  3rd Attempt.  Referral closed unable to contact patient. ? ?A HIPAA compliant voice message was left requesting a return call.  Instructed patient to call back at   Instructed patient to call back at [phone]  at their earliest convenience. . ? ?[name] ?Care Guide , Embedded Care Coordination ?[place], Care Management  ?[phone] ?[address] , [place] [address] ?Email : [email or web address] ?  ?

## 2021-11-01 ENCOUNTER — Ambulatory Visit: Payer: Commercial Managed Care - PPO | Admitting: Family Medicine

## 2021-12-10 ENCOUNTER — Encounter: Payer: Self-pay | Admitting: *Deleted

## 2021-12-12 ENCOUNTER — Other Ambulatory Visit: Payer: Self-pay | Admitting: Family Medicine

## 2021-12-12 DIAGNOSIS — E119 Type 2 diabetes mellitus without complications: Secondary | ICD-10-CM

## 2022-01-29 ENCOUNTER — Ambulatory Visit: Payer: Commercial Managed Care - PPO | Admitting: Obstetrics and Gynecology

## 2022-04-11 ENCOUNTER — Encounter: Payer: Self-pay | Admitting: Internal Medicine

## 2022-04-11 ENCOUNTER — Ambulatory Visit: Payer: BC Managed Care – PPO | Admitting: Internal Medicine

## 2022-04-11 VITALS — BP 123/68 | HR 96 | Ht 65.0 in | Wt 125.6 lb

## 2022-04-11 DIAGNOSIS — F339 Major depressive disorder, recurrent, unspecified: Secondary | ICD-10-CM

## 2022-04-11 DIAGNOSIS — E785 Hyperlipidemia, unspecified: Secondary | ICD-10-CM

## 2022-04-11 DIAGNOSIS — E119 Type 2 diabetes mellitus without complications: Secondary | ICD-10-CM | POA: Diagnosis not present

## 2022-04-11 DIAGNOSIS — Z2821 Immunization not carried out because of patient refusal: Secondary | ICD-10-CM

## 2022-04-11 DIAGNOSIS — Z7689 Persons encountering health services in other specified circumstances: Secondary | ICD-10-CM | POA: Diagnosis not present

## 2022-04-11 DIAGNOSIS — Z1231 Encounter for screening mammogram for malignant neoplasm of breast: Secondary | ICD-10-CM

## 2022-04-11 DIAGNOSIS — Z1329 Encounter for screening for other suspected endocrine disorder: Secondary | ICD-10-CM | POA: Diagnosis not present

## 2022-04-11 DIAGNOSIS — G8929 Other chronic pain: Secondary | ICD-10-CM

## 2022-04-11 DIAGNOSIS — K909 Intestinal malabsorption, unspecified: Secondary | ICD-10-CM

## 2022-04-11 DIAGNOSIS — M25511 Pain in right shoulder: Secondary | ICD-10-CM

## 2022-04-11 DIAGNOSIS — F411 Generalized anxiety disorder: Secondary | ICD-10-CM

## 2022-04-11 DIAGNOSIS — Z0001 Encounter for general adult medical examination with abnormal findings: Secondary | ICD-10-CM | POA: Insufficient documentation

## 2022-04-11 DIAGNOSIS — M7918 Myalgia, other site: Secondary | ICD-10-CM

## 2022-04-11 DIAGNOSIS — Z1321 Encounter for screening for nutritional disorder: Secondary | ICD-10-CM

## 2022-04-11 HISTORY — DX: Persons encountering health services in other specified circumstances: Z76.89

## 2022-04-11 MED ORDER — DULOXETINE HCL 30 MG PO CPEP: 30.0000 mg | ORAL_CAPSULE | Freq: Every day | ORAL | 0 refills | Status: DC

## 2022-04-11 NOTE — Assessment & Plan Note (Signed)
Last HgbA1c 7.1 in March.  She is currently prescribed Glucophage-XR 500 mg daily.  Denies symptoms of polyuria/polydipsia.  She is not checking her blood sugar regularly. -Repeat A1c today -Diabetic foot exam completed today -Referral placed to ophthalmology

## 2022-04-11 NOTE — Assessment & Plan Note (Signed)
Not currently on medication.  Her last lipid panel was updated in March.  LDL 142.  Total cholesterol 231. -Repeat lipid panel today.  I have discussed with Joy Bautista that we will most likely need to start a medication to lower her cholesterol at follow-up in 1 month.

## 2022-04-11 NOTE — Progress Notes (Signed)
New Patient Office Visit  Subjective    Patient ID: Joy Bautista, female    DOB: [birth year]  Age: 56 y.o. MRN: [id number]  CC:  Chief Complaint  Patient presents with   Establish Care    After eating lunch mainly fried foods gets diarrhea. Ortho referral    HPI Joy Bautista presents to establish care.  She is a 56 year old woman with a past medical history significant for T2DM, anxiety/depression, HLD, and chronic MSK pain.  She was previously followed at the [facility].  She has recently moved to [place], which is why she would like to establish care closer to home.  Today Joy Bautista states that she feels fairly well overall.  She is concerned about chronic pain in her right shoulder and pain in the fifth digit of her right hand.  She states these have been present for over a year now. Joy Bautista was previously evaluated at the [facility] and her right shoulder pain was attributed to adhesive capsulitis.  The pain in the fifth digit of her right hand was attributed to carpal tunnel syndrome.  She has been taking meloxicam and performing home PT exercises for the right shoulder, which helped.  The pain in the fifth digit of her right hand seems to cause her more discomfort than the shoulder pain currently.  Joy Bautista would also like to discuss frequent diarrhea.  She has noticed this after eating fast food and after eating tomato soup or spaghetti.  She denies noticing any blood in her stool.  She is not experienced any recent weight loss.  Acute concerns, chronic medical conditions, and outstanding preventative healthcare maintenance items discussed today are individually addressed in A/P below.  Outpatient Encounter Medications as of 04/11/2022  Medication Sig   acetaminophen (ARTHRITIS PAIN APAP) 650 MG CR tablet Take 650 mg by mouth every 8 (eight) hours as needed for pain.   DULoxetine (CYMBALTA) 30 MG capsule  Take 1 capsule (30 mg total) by mouth daily.   metFORMIN (GLUCOPHAGE-XR) 500 MG 24 hr tablet TAKE 1 TABLET BY MOUTH ONCE DAILY WITH SUPPER.   polyethylene glycol powder (GLYCOLAX/MIRALAX) 17 GM/SCOOP powder Take 17 g by mouth 2 (two) times daily as needed.   No facility-administered encounter medications on file as of 04/11/2022.    Past Medical History:  Diagnosis Date   Allergy    Anxiety    Arthritis    Breast cancer screening by mammogram 05/25/2020   Cataract    bilateral   Cervical cancer screening 01/25/2014   Chest pain    CTS (carpal tunnel syndrome)    Depression    Diabetes mellitus without complication ([place])    Dizziness    Gout    Kidney stone 08/08/2019   Stone confirmed to be calcium oxalate.   SOB (shortness of breath)     Past Surgical History:  Procedure Laterality Date   CATARACT EXTRACTION     EYE SURGERY     Neck fusion C5-6     TONSILECTOMY, ADENOIDECTOMY, BILATERAL MYRINGOTOMY AND TUBES      History reviewed. No pertinent family history.  Social History   Socioeconomic History   Marital status: Divorced    Spouse name: Not on file   Number of children: Not on file   Years of education: Not on file   Highest education level: Not on file  Occupational History   Not on file  Tobacco Use   Smoking status:  Former    Types: Cigarettes    Quit date: 10/30/1990    Years since quitting: 31.4   Smokeless tobacco: Never  Substance and Sexual Activity   Alcohol use: No   Drug use: No   Sexual activity: Not Currently  Other Topics Concern   Not on file  Social History Narrative   Not on file   Social Determinants of Health   Financial Resource Strain: Not on file  Food Insecurity: Not on file  Transportation Needs: Not on file  Physical Activity: Not on file  Stress: Not on file  Social Connections: Not on file  Intimate Partner Violence: Not on file   Review of Systems  Constitutional:  Negative for chills and fever.  HENT:  Negative  for sore throat.   Respiratory:  Negative for cough and shortness of breath.   Cardiovascular:  Negative for chest pain, palpitations and leg swelling.  Gastrointestinal:  Positive for diarrhea. Negative for abdominal pain, blood in stool, constipation, nausea and vomiting.  Genitourinary:  Negative for dysuria and hematuria.  Musculoskeletal:  Positive for joint pain (Right shoulder and PIP/DIP of fifth digit of right hand). Negative for myalgias.  Skin:  Negative for itching and rash.  Neurological:  Negative for dizziness and headaches.  Psychiatric/Behavioral:  Negative for depression and suicidal ideas. The patient is not nervous/anxious.    Objective    BP 123/68   Pulse 96   Ht _0  (1.651 m)   Wt 125 lb 9.6 oz (57 kg)   LMP 03/22/2016   SpO2 96%   BMI 20.90 kg/m   Physical Exam Constitutional:      General: She is not in acute distress.    Appearance: Normal appearance. She is not toxic-appearing.  HENT:     Head: Normocephalic and atraumatic.     Right Ear: External ear normal.     Left Ear: External ear normal.     Nose: Nose normal. No congestion or rhinorrhea.     Mouth/Throat:     Mouth: Mucous membranes are moist.     Pharynx: Oropharynx is clear. No oropharyngeal exudate or posterior oropharyngeal erythema.  Eyes:     General: No scleral icterus.    Extraocular Movements: Extraocular movements intact.     Conjunctiva/sclera: Conjunctivae normal.     Pupils: Pupils are equal, round, and reactive to light.  Cardiovascular:     Rate and Rhythm: Normal rate and regular rhythm.     Pulses: Normal pulses.     Heart sounds: Murmur heard.  Abdominal:     General: Abdomen is flat. Bowel sounds are normal. There is no distension.     Palpations: Abdomen is soft.     Tenderness: There is no abdominal tenderness.  Musculoskeletal:     Cervical back: Normal range of motion. No tenderness.     Comments: Reduced IR/ER and abduction beyond 90 degrees of the right  shoulder.  Negative Hawkins/empty can.  ROM at the DIP, PIP, and MCP of the fifth digit of the right hand is intact.  There is no tenderness palpation or deformity appreciated on inspection.  Skin:    General: Skin is dry.     Capillary Refill: Capillary refill takes less than 2 seconds.     Coloration: Skin is not jaundiced.  Neurological:     General: No focal deficit present.     Mental Status: She is alert and oriented to person, place, and time.  Psychiatric:  Mood and Affect: Mood normal.        Behavior: Behavior normal.    Diabetic foot exam was performed.  No deformities or other abnormal visual findings.  Posterior tibialis and dorsalis pulse intact bilaterally.  Intact to touch and monofilament testing bilaterally.    Last CBC Lab Results  Component Value Date   WBC 6.6 01/11/2018   HGB 13.6 01/11/2018   HCT 41.3 01/11/2018   MCV 92 01/11/2018   MCH 30.2 01/11/2018   RDW 13.1 01/11/2018   PLT 340 97/08/6376   Last metabolic panel Lab Results  Component Value Date   GLUCOSE 163 (H) 09/16/2021   NA 149 (H) 09/16/2021   K 4.7 09/16/2021   CL 112 (H) 09/16/2021   CO2 24 09/16/2021   BUN 13 09/16/2021   CREATININE 0.78 09/16/2021   EGFR 88 09/16/2021   CALCIUM 9.8 09/16/2021   PROT 6.6 09/16/2021   ALBUMIN 4.6 09/16/2021   LABGLOB 2.0 09/16/2021   AGRATIO 2.3 (H) 09/16/2021   BILITOT 0.4 09/16/2021   ALKPHOS 68 09/16/2021   AST 19 09/16/2021   ALT 21 09/16/2021   ANIONGAP 12 01/31/2020   Last lipids Lab Results  Component Value Date   CHOL 231 (H) 09/05/2021   HDL 69 09/05/2021   LDLCALC 142 (H) 09/05/2021   TRIG 115 09/05/2021   CHOLHDL 3.3 09/05/2021   Last hemoglobin A1c Lab Results  Component Value Date   HGBA1C 7.1 (A) 09/05/2021   Last thyroid functions Lab Results  Component Value Date   TSH 1.570 01/11/2018   Assessment & Plan:   Problem List Items Addressed This Visit       Diabetes mellitus without complication ([place])     Last HgbA1c 7.1 in March.  She is currently prescribed Glucophage-XR 500 mg daily.  Denies symptoms of polyuria/polydipsia.  She is not checking her blood sugar regularly. -Repeat A1c today -Diabetic foot exam completed today -Referral placed to ophthalmology      Depression, recurrent ([place])    Previously documented history of anxiety/depression.  She was titrated off antidepressants and has noted a worsening of her mood since that time. -Start Cymbalta 30 mg daily today.  Can increase to 60 mg daily after 1 week.  This should also help with chronic MSK pain.  Follow-up in 1 month for reassessment.      Hyperlipidemia    Not currently on medication.  Her last lipid panel was updated in March.  LDL 142.  Total cholesterol 231. -Repeat lipid panel today.  I have discussed with Ms. Naves that we will most likely need to start a medication to lower her cholesterol at follow-up in 1 month.      Steatorrhea    She describes foul-smelling, oily diarrhea after fatty meals.  This most commonly occurs after she eats fast food for lunch.  She plans to try to avoid known triggers for diarrhea over the next month.  If there is no improvement in her symptoms, consider cholestyramine.  She can continue to use Imodium as needed for now.      Chronic musculoskeletal pain    Today she endorses chronic right shoulder pain as well as pain in the fifth digit of her right hand.  This has been present for > 1 year.  Her right shoulder pain is associated with known adhesive capsulitis.  I recommended that she continue home PT exercises.  The pain in her 5th digit of the right hand has been attributed to  carpal tunnel syndrome.  I recommend she continue to wear a cock up wrist splint.  I have also prescribed Cymbalta 30 mg daily today with plans increase to 60 mg after 7 days pending tolerance.  She will return to care in 1 month for reassessment.      Encounter to establish care    Presenting today to establish  care.  Recent medical records and lab work reviewed. -Baseline labs ordered today -She declined outstanding influenza, COVID-19, and Shingrix vaccines -HgbA1c ordered today -Diabetic foot exam completed today -She was referred to ophthalmology for routine diabetic eye care      Return in about 4 weeks (around 05/09/2022).   Johnette Abraham, MD

## 2022-04-11 NOTE — Assessment & Plan Note (Signed)
Today she endorses chronic right shoulder pain as well as pain in the fifth digit of her right hand.  This has been present for > 1 year.  Her right shoulder pain is associated with known adhesive capsulitis.  I recommended that she continue home PT exercises.  The pain in her 5th digit of the right hand has been attributed to carpal tunnel syndrome.  I recommend she continue to wear a cock up wrist splint.  I have also prescribed Cymbalta 30 mg daily today with plans increase to 60 mg after 7 days pending tolerance.  She will return to care in 1 month for reassessment.

## 2022-04-11 NOTE — Patient Instructions (Signed)
It was a pleasure to see you today.  Thank you for giving [place] the opportunity to be involved in your care.  Below is a brief recap of your visit and next steps.  We will plan to see you again in 1 month.  Summary We will check basic labs I have prescribed cymbalta I have referred you for mammogram and to an eye doctor today  Next steps Follow up in 1 month I will notify you of lab results I recommend continuing to perform your shoulder exercises regularly

## 2022-04-11 NOTE — Assessment & Plan Note (Signed)
Previously documented history of anxiety/depression.  She was titrated off antidepressants and has noted a worsening of her mood since that time. -Start Cymbalta 30 mg daily today.  Can increase to 60 mg daily after 1 week.  This should also help with chronic MSK pain.  Follow-up in 1 month for reassessment.

## 2022-04-11 NOTE — Assessment & Plan Note (Signed)
She describes foul-smelling, oily diarrhea after fatty meals.  This most commonly occurs after she eats fast food for lunch.  She plans to try to avoid known triggers for diarrhea over the next month.  If there is no improvement in her symptoms, consider cholestyramine.  She can continue to use Imodium as needed for now.

## 2022-04-11 NOTE — Assessment & Plan Note (Signed)
Presenting today to establish care.  Recent medical records and lab work reviewed. -Baseline labs ordered today -She declined outstanding influenza, COVID-19, and Shingrix vaccines -HgbA1c ordered today -Diabetic foot exam completed today -She was referred to ophthalmology for routine diabetic eye care

## 2022-04-12 LAB — LIPID PANEL
Chol/HDL Ratio: 3.5 ratio (ref 0.0–4.4)
Cholesterol, Total: 198 mg/dL (ref 100–199)
HDL: 56 mg/dL (ref >39)
LDL Chol Calc (NIH): 114 mg/dL — ABNORMAL HIGH (ref 0–99)
Triglycerides: 161 mg/dL — ABNORMAL HIGH (ref 0–149)
VLDL Cholesterol Cal: 28 mg/dL (ref 5–40)

## 2022-04-12 LAB — CBC WITH DIFFERENTIAL/PLATELET
Basophils Absolute: 0.1 10*3/uL (ref 0.0–0.2)
Basos: 2 %
EOS (ABSOLUTE): 0.1 10*3/uL (ref 0.0–0.4)
Eos: 2 %
Hematocrit: 39.9 % (ref 34.0–46.6)
Hemoglobin: 13.3 g/dL (ref 11.1–15.9)
Immature Grans (Abs): 0 10*3/uL (ref 0.0–0.1)
Immature Granulocytes: 0 %
Lymphocytes Absolute: 2.3 10*3/uL (ref 0.7–3.1)
Lymphs: 42 %
MCH: 30.3 pg (ref 26.6–33.0)
MCHC: 33.3 g/dL (ref 31.5–35.7)
MCV: 91 fL (ref 79–97)
Monocytes Absolute: 0.3 10*3/uL (ref 0.1–0.9)
Monocytes: 6 %
Neutrophils Absolute: 2.6 10*3/uL (ref 1.4–7.0)
Neutrophils: 48 %
Platelets: 334 10*3/uL (ref 150–450)
RBC: 4.39 x10E6/uL (ref 3.77–5.28)
RDW: 12.2 % (ref 11.7–15.4)
WBC: 5.3 10*3/uL (ref 3.4–10.8)

## 2022-04-12 LAB — CMP14+EGFR
ALT: 31 IU/L (ref 0–32)
AST: 28 IU/L (ref 0–40)
Albumin/Globulin Ratio: 2 (ref 1.2–2.2)
Albumin: 4.7 g/dL (ref 3.8–4.9)
Alkaline Phosphatase: 73 IU/L (ref 44–121)
BUN/Creatinine Ratio: 11 (ref 9–23)
BUN: 11 mg/dL (ref 6–24)
Bilirubin Total: 0.4 mg/dL (ref 0.0–1.2)
CO2: 24 mmol/L (ref 20–29)
Calcium: 10.1 mg/dL (ref 8.7–10.2)
Chloride: 104 mmol/L (ref 96–106)
Creatinine, Ser: 0.99 mg/dL (ref 0.57–1.00)
Globulin, Total: 2.3 g/dL (ref 1.5–4.5)
Glucose: 252 mg/dL — ABNORMAL HIGH (ref 70–99)
Potassium: 4.1 mmol/L (ref 3.5–5.2)
Sodium: 142 mmol/L (ref 134–144)
Total Protein: 7 g/dL (ref 6.0–8.5)
eGFR: 66 mL/min/{1.73_m2} (ref >59)

## 2022-04-12 LAB — VITAMIN D 25 HYDROXY (VIT D DEFICIENCY, FRACTURES): Vit D, 25-Hydroxy: 21.8 ng/mL — ABNORMAL LOW (ref 30.0–100.0)

## 2022-04-12 LAB — HEMOGLOBIN A1C
Est. average glucose Bld gHb Est-mCnc: 154 mg/dL
Hgb A1c MFr Bld: 7 % — ABNORMAL HIGH (ref 4.8–5.6)

## 2022-04-12 LAB — TSH+FREE T4
Free T4: 1.59 ng/dL (ref 0.82–1.77)
TSH: 0.958 u[IU]/mL (ref 0.450–4.500)

## 2022-04-14 ENCOUNTER — Ambulatory Visit (HOSPITAL_COMMUNITY)
Admission: RE | Admit: 2022-04-14 | Discharge: 2022-04-14 | Disposition: A | Discharge status: Home / Self Care | Payer: BC Managed Care – PPO | Source: Ambulatory Visit | Attending: Internal Medicine | Admitting: Internal Medicine

## 2022-04-14 DIAGNOSIS — Z1231 Encounter for screening mammogram for malignant neoplasm of breast: Secondary | ICD-10-CM | POA: Insufficient documentation

## 2022-04-16 ENCOUNTER — Other Ambulatory Visit (HOSPITAL_COMMUNITY): Payer: Self-pay | Admitting: Internal Medicine

## 2022-04-16 DIAGNOSIS — R928 Other abnormal and inconclusive findings on diagnostic imaging of breast: Secondary | ICD-10-CM

## 2022-05-06 ENCOUNTER — Encounter (HOSPITAL_COMMUNITY): Payer: Self-pay

## 2022-05-13 ENCOUNTER — Encounter (HOSPITAL_COMMUNITY): Payer: Self-pay

## 2022-05-13 ENCOUNTER — Ambulatory Visit (HOSPITAL_COMMUNITY): Payer: BC Managed Care – PPO

## 2022-05-13 ENCOUNTER — Inpatient Hospital Stay (HOSPITAL_COMMUNITY): Admission: RE | Admit: 2022-05-13 | Payer: BC Managed Care – PPO | Source: Ambulatory Visit

## 2022-05-16 ENCOUNTER — Ambulatory Visit: Payer: BC Managed Care – PPO | Admitting: Internal Medicine

## 2022-05-16 ENCOUNTER — Encounter: Payer: Self-pay | Admitting: Internal Medicine

## 2022-05-16 VITALS — BP 122/67 | HR 98 | Ht 65.0 in | Wt 126.2 lb

## 2022-05-16 DIAGNOSIS — M25512 Pain in left shoulder: Secondary | ICD-10-CM

## 2022-05-16 DIAGNOSIS — F411 Generalized anxiety disorder: Secondary | ICD-10-CM | POA: Diagnosis not present

## 2022-05-16 DIAGNOSIS — Z0001 Encounter for general adult medical examination with abnormal findings: Secondary | ICD-10-CM | POA: Diagnosis not present

## 2022-05-16 DIAGNOSIS — F339 Major depressive disorder, recurrent, unspecified: Secondary | ICD-10-CM

## 2022-05-16 DIAGNOSIS — N63 Unspecified lump in unspecified breast: Secondary | ICD-10-CM | POA: Insufficient documentation

## 2022-05-16 DIAGNOSIS — M25511 Pain in right shoulder: Secondary | ICD-10-CM

## 2022-05-16 DIAGNOSIS — E119 Type 2 diabetes mellitus without complications: Secondary | ICD-10-CM

## 2022-05-16 DIAGNOSIS — G8929 Other chronic pain: Secondary | ICD-10-CM

## 2022-05-16 DIAGNOSIS — E785 Hyperlipidemia, unspecified: Secondary | ICD-10-CM

## 2022-05-16 MED ORDER — DULOXETINE HCL 30 MG PO CPEP: 60.0000 mg | ORAL_CAPSULE | Freq: Every day | ORAL | 0 refills | Status: DC

## 2022-05-16 MED ORDER — METFORMIN HCL ER 500 MG PO TB24: 1000.0000 mg | ORAL_TABLET | Freq: Every day | ORAL | 1 refills | Status: DC

## 2022-05-16 NOTE — Progress Notes (Signed)
Established Patient Office Visit  Subjective   Patient ID: Joy Bautista, female    DOB: [birth year]  Age: 56 y.o. MRN: [id number]  Chief Complaint  Patient presents with   Follow-up   Joy Bautista returns to care today.  She is a 56 year old woman with a past medical history significant for T2DM, anxiety/depression, HLD, and chronic musculoskeletal pain.  She was last seen by me to establish care on 10/6.  At that time baseline labs were ordered, Cymbalta 30 mg daily was started for treatment of depression and chronic musculoskeletal pain.  4-week follow-up was arranged for reassessment.  There have been no acute interval events.  Today Joy Bautista states that she feels well.  Cymbalta has helped both her mood and chronic musculoskeletal pain.  She states that the pain along the ulnar aspect of her right hand is improving.  Her right shoulder pain seems to be improving as well.  She does endorse left lateral shoulder pain.  There is no limitation in range of motion, however she is not able to sleep on her left side due to discomfort.  She previously endorsed diarrhea but states that that is improved.  In light of recent lab results, she has made significant dietary changes aimed at lowering her cholesterol.  She has no additional concerns to discuss today.  Past Medical History:  Diagnosis Date   Allergy    Anxiety    Arthritis    Breast cancer screening by mammogram 05/25/2020   Cataract    bilateral   Cervical cancer screening 01/25/2014   Chest pain    CTS (carpal tunnel syndrome)    Depression    Diabetes mellitus without complication ([place])    Dizziness    Gout    Kidney stone 08/08/2019   Stone confirmed to be calcium oxalate.   SOB (shortness of breath)    Past Surgical History:  Procedure Laterality Date   CATARACT EXTRACTION     EYE SURGERY     Neck fusion C5-6     TONSILECTOMY, ADENOIDECTOMY, BILATERAL MYRINGOTOMY AND TUBES     Social History   Tobacco Use    Smoking status: Former    Types: Cigarettes    Quit date: 10/30/1990    Years since quitting: 31.5   Smokeless tobacco: Never  Substance Use Topics   Alcohol use: No   Drug use: No   History reviewed. No pertinent family history. Allergies  Allergen Reactions   Shellfish Allergy    Sulfonamide Derivatives    Review of Systems  Constitutional:  Negative for chills and fever.  HENT:  Negative for sore throat.   Respiratory:  Negative for cough and shortness of breath.   Cardiovascular:  Negative for chest pain, palpitations and leg swelling.  Gastrointestinal:  Negative for abdominal pain, blood in stool, constipation, diarrhea, nausea and vomiting.  Genitourinary:  Negative for dysuria and hematuria.  Musculoskeletal:  Positive for joint pain (Bilateral shoulder pain). Negative for myalgias.  Skin:  Negative for itching and rash.  Neurological:  Negative for dizziness and headaches.  Psychiatric/Behavioral:  Negative for depression and suicidal ideas.      Objective:     BP 122/67   Pulse 98   Ht _0  (1.651 m)   Wt 126 lb 3.2 oz (57.2 kg)   LMP 03/22/2016   SpO2 96%   BMI 21.00 kg/m  BP Readings from Last 3 Encounters:  05/16/22 122/67  04/11/22 123/68  10/09/21 112/70   Physical Exam  Vitals reviewed.  Constitutional:      General: She is not in acute distress.    Appearance: Normal appearance. She is not toxic-appearing.  HENT:     Head: Normocephalic and atraumatic.     Right Ear: External ear normal.     Left Ear: External ear normal.     Nose: Nose normal. No congestion or rhinorrhea.     Mouth/Throat:     Mouth: Mucous membranes are moist.     Pharynx: Oropharynx is clear. No oropharyngeal exudate or posterior oropharyngeal erythema.  Eyes:     General: No scleral icterus.    Extraocular Movements: Extraocular movements intact.     Conjunctiva/sclera: Conjunctivae normal.     Pupils: Pupils are equal, round, and reactive to light.  Cardiovascular:      Rate and Rhythm: Normal rate and regular rhythm.     Pulses: Normal pulses.     Heart sounds: Murmur heard.     No friction rub. No gallop.  Pulmonary:     Effort: Pulmonary effort is normal.     Breath sounds: Normal breath sounds. No wheezing, rhonchi or rales.  Abdominal:     General: Abdomen is flat. Bowel sounds are normal. There is no distension.     Palpations: Abdomen is soft.     Tenderness: There is no abdominal tenderness.  Musculoskeletal:        General: Tenderness (Left lateral shoulder) present. No swelling.     Cervical back: Normal range of motion.     Right lower leg: No edema.     Left lower leg: No edema.     Comments: Reduced IR of the right shoulder.  ROM of the left shoulder is intact.  Lymphadenopathy:     Cervical: No cervical adenopathy.  Skin:    General: Skin is warm and dry.     Capillary Refill: Capillary refill takes less than 2 seconds.     Coloration: Skin is not jaundiced.  Neurological:     General: No focal deficit present.     Mental Status: She is alert and oriented to person, place, and time.  Psychiatric:        Mood and Affect: Mood normal.        Behavior: Behavior normal.    Last CBC Lab Results  Component Value Date   WBC 5.3 04/11/2022   HGB 13.3 04/11/2022   HCT 39.9 04/11/2022   MCV 91 04/11/2022   MCH 30.3 04/11/2022   RDW 12.2 04/11/2022   PLT 334 79/39/0300   Last metabolic panel Lab Results  Component Value Date   GLUCOSE 252 (H) 04/11/2022   NA 142 04/11/2022   K 4.1 04/11/2022   CL 104 04/11/2022   CO2 24 04/11/2022   BUN 11 04/11/2022   CREATININE 0.99 04/11/2022   EGFR 66 04/11/2022   CALCIUM 10.1 04/11/2022   PROT 7.0 04/11/2022   ALBUMIN 4.7 04/11/2022   LABGLOB 2.3 04/11/2022   AGRATIO 2.0 04/11/2022   BILITOT 0.4 04/11/2022   ALKPHOS 73 04/11/2022   AST 28 04/11/2022   ALT 31 04/11/2022   ANIONGAP 12 01/31/2020   Last lipids Lab Results  Component Value Date   CHOL 198 04/11/2022   HDL 56  04/11/2022   LDLCALC 114 (H) 04/11/2022   TRIG 161 (H) 04/11/2022   CHOLHDL 3.5 04/11/2022   Last hemoglobin A1c Lab Results  Component Value Date   HGBA1C 7.0 (H) 04/11/2022   Last thyroid functions Lab Results  Component Value Date   TSH 0.958 04/11/2022   Last vitamin D Lab Results  Component Value Date   VD25OH 21.8 (L) 04/11/2022    The 10-year ASCVD risk score (Arnett DK, et al., 2019) is: 4.6%    Assessment & Plan:   Problem List Items Addressed This Visit       Diabetes mellitus without complication (HCC)    M6N 7.0 last month.  She is currently prescribed Glucophage XR 500 mg daily.  She does not routinely check her blood sugar. -Increase metformin to 1000 mg daily -She has declined statin therapy treatment of HLD and ACEi for renal protection -Previously referred to ophthalmology for diabetic eye exam, however she would like to find a provider in [place].  We will work on this.  Diabetes related HM items are otherwise up-to-date.      Hyperlipidemia    Lipid panel updated last month.  Total cholesterol 198, LDL 114.  Her 10-year ASCVD risk score is 4.6%. -She has made significant dietary changes aimed at lowering her cholesterol and declines initiation of statin therapy in the setting of diabetes at this time.  We will plan for follow-up in 3 months with repeat lipid panel at that time      Chronic right shoulder pain    Prior history of adhesive capsulitis in the right shoulder.  IR is limited on exam today.  ROM is otherwise generally intact.  She has previously been provided with home PT exercises to complete but has not been doing them. -I recommended that she resume home PT exercises to improve ROM of the right shoulder      Left shoulder pain    She endorses a recent history of left lateral shoulder pain and is unable to sleep on her left shoulder at night.  ROM is generally intact.  This could represent early development of impingement syndrome.  I  recommended that she perform home PT exercises on her left shoulder as well as her right in order to maintain/improve her range of motion.      Breast mass seen on mammogram    A possible left breast mass was identified on screening mammogram last month.  Diagnostic mammogram and left breast ultrasound are recommended.  These have been ordered but have not been completed yet.      Return in about 3 months (around 08/16/2022).    Johnette Abraham, MD

## 2022-05-16 NOTE — Assessment & Plan Note (Addendum)
Lipid panel updated last month.  Total cholesterol 198, LDL 114.  Her 10-year ASCVD risk score is 4.6%. -She has made significant dietary changes aimed at lowering her cholesterol and declines initiation of statin therapy in the setting of diabetes at this time.  We will plan for follow-up in 3 months with repeat lipid panel at that time

## 2022-05-16 NOTE — Assessment & Plan Note (Addendum)
A1c 7.0 last month.  She is currently prescribed Glucophage XR 500 mg daily.  She does not routinely check her blood sugar. -Increase metformin to 1000 mg daily -She has declined statin therapy treatment of HLD and ACEi for renal protection -Previously referred to ophthalmology for diabetic eye exam, however she would like to find a provider in [place].  We will work on this.  Diabetes related HM items are otherwise up-to-date.

## 2022-05-16 NOTE — Patient Instructions (Signed)
It was a pleasure to see you today.  Thank you for giving [place] the opportunity to be involved in your care.  Below is a brief recap of your visit and next steps.  We will plan to see you again in 3 months.  Summary Increase metformin to 1000 mg daily Increase Cymbalta to 60 mg daily We will repeat labs in February before your appointment

## 2022-05-16 NOTE — Assessment & Plan Note (Signed)
A possible left breast mass was identified on screening mammogram last month.  Diagnostic mammogram and left breast ultrasound are recommended.  These have been ordered but have not been completed yet.

## 2022-05-16 NOTE — Assessment & Plan Note (Signed)
She endorses a recent history of left lateral shoulder pain and is unable to sleep on her left shoulder at night.  ROM is generally intact.  This could represent early development of impingement syndrome.  I recommended that she perform home PT exercises on her left shoulder as well as her right in order to maintain/improve her range of motion.

## 2022-05-16 NOTE — Assessment & Plan Note (Signed)
Prior history of adhesive capsulitis in the right shoulder.  IR is limited on exam today.  ROM is otherwise generally intact.  She has previously been provided with home PT exercises to complete but has not been doing them. -I recommended that she resume home PT exercises to improve ROM of the right shoulder

## 2022-05-26 ENCOUNTER — Telehealth: Payer: Self-pay | Admitting: Internal Medicine

## 2022-05-26 ENCOUNTER — Other Ambulatory Visit: Payer: Self-pay

## 2022-05-26 DIAGNOSIS — G8929 Other chronic pain: Secondary | ICD-10-CM

## 2022-05-26 DIAGNOSIS — F339 Major depressive disorder, recurrent, unspecified: Secondary | ICD-10-CM

## 2022-05-26 DIAGNOSIS — F411 Generalized anxiety disorder: Secondary | ICD-10-CM

## 2022-05-26 MED ORDER — DULOXETINE HCL 60 MG PO CPEP: 60.0000 mg | ORAL_CAPSULE | Freq: Every day | ORAL | 0 refills | Status: DC

## 2022-05-26 NOTE — Telephone Encounter (Signed)
Refills sent

## 2022-05-26 NOTE — Telephone Encounter (Signed)
Pt called stating since she is taking 2 pills daily she is out. Can you please refill?    Prescription Request  05/26/2022  Is this a "Controlled Substance" medicine? No  LOV: 05/16/2022   What is the name of the medication or equipment? DULoxetine (CYMBALTA) 30 MG capsule   Have you contacted your pharmacy to request a refill? No   Which pharmacy would you like this sent to?  [facility] [phone] - [place], [place] - [address] AT [facility] [address] [place] [address] Phone: [phone] Fax: [phone]   Patient notified that their request is being sent to the clinical staff for review and that they should receive a response within 2 business days.   Please advise at [phone] (mobile)

## 2022-06-02 ENCOUNTER — Other Ambulatory Visit: Payer: Self-pay

## 2022-06-02 ENCOUNTER — Telehealth: Payer: Self-pay | Admitting: Internal Medicine

## 2022-06-02 DIAGNOSIS — G8929 Other chronic pain: Secondary | ICD-10-CM

## 2022-06-02 DIAGNOSIS — F411 Generalized anxiety disorder: Secondary | ICD-10-CM

## 2022-06-02 DIAGNOSIS — F339 Major depressive disorder, recurrent, unspecified: Secondary | ICD-10-CM

## 2022-06-02 MED ORDER — DULOXETINE HCL 60 MG PO CPEP: 60.0000 mg | ORAL_CAPSULE | Freq: Every day | ORAL | 0 refills | Status: DC

## 2022-06-02 NOTE — Telephone Encounter (Signed)
Pt called stating since she is taking 2 pills daily she is out. States pharm has not received updated prescription.  Patient is down to 3 pills.

## 2022-08-22 ENCOUNTER — Encounter: Payer: Self-pay | Admitting: Internal Medicine

## 2022-08-22 ENCOUNTER — Ambulatory Visit: Payer: BC Managed Care – PPO | Admitting: Internal Medicine

## 2022-08-22 VITALS — BP 124/77 | HR 108 | Ht 65.0 in | Wt 122.2 lb

## 2022-08-22 DIAGNOSIS — G8929 Other chronic pain: Secondary | ICD-10-CM

## 2022-08-22 DIAGNOSIS — E559 Vitamin D deficiency, unspecified: Secondary | ICD-10-CM | POA: Diagnosis not present

## 2022-08-22 DIAGNOSIS — F411 Generalized anxiety disorder: Secondary | ICD-10-CM

## 2022-08-22 DIAGNOSIS — E119 Type 2 diabetes mellitus without complications: Secondary | ICD-10-CM | POA: Diagnosis not present

## 2022-08-22 DIAGNOSIS — E7841 Elevated Lipoprotein(a): Secondary | ICD-10-CM | POA: Diagnosis not present

## 2022-08-22 DIAGNOSIS — N63 Unspecified lump in unspecified breast: Secondary | ICD-10-CM

## 2022-08-22 DIAGNOSIS — M7652 Patellar tendinitis, left knee: Secondary | ICD-10-CM

## 2022-08-22 DIAGNOSIS — F339 Major depressive disorder, recurrent, unspecified: Secondary | ICD-10-CM

## 2022-08-22 DIAGNOSIS — M25511 Pain in right shoulder: Secondary | ICD-10-CM

## 2022-08-22 MED ORDER — DULOXETINE HCL 60 MG PO CPEP: 60.0000 mg | ORAL_CAPSULE | Freq: Every day | ORAL | 1 refills | Status: DC

## 2022-08-22 NOTE — Progress Notes (Unsigned)
Established Patient Office Visit  Subjective   Patient ID: Joy Bautista, female    DOB: 1965-09-13  Age: 57 y.o. MRN: [id number]  Chief Complaint  Patient presents with   Hyperlipidemia    Follow up   Joy Bautista returns to care today for 76-monthfollow-up.  She was last seen by me on 05/16/22.  Metformin was increased to 1000 mg twice daily and no additional medication changes were made.  She declined to initiate statin therapy and preferred to focus on lifestyle modifications aimed at improving her cholesterol with plans to repeat a lipid panel in 3 months.  She additionally endorsed bilateral shoulder pain and was provided with home PT exercises.  There have been no acute interval events.  Ms. BBaluyotcontinues to endorse bilateral shoulder pain today.  She additionally endorses lumbar back pain as well as left knee pain.  Her left knee pain has been present x 2 weeks and is worse with going up and down stairs.  She has no additional concerns to discuss today.  Past Medical History:  Diagnosis Date   Allergy    Anxiety    Arthritis    Breast cancer screening by mammogram 05/25/2020   Cataract    bilateral   Cervical cancer screening 01/25/2014   Chest pain    CTS (carpal tunnel syndrome)    Depression    Diabetes mellitus [place] complication ([place]    Dizziness    Encounter to establish care 04/11/2022   Gout    Kidney stone 08/08/2019   Stone confirmed to be calcium oxalate.   SOB (shortness of breath)    Past Surgical History:  Procedure Laterality Date   CATARACT EXTRACTION     EYE SURGERY     Neck fusion C5-6     TONSILECTOMY, ADENOIDECTOMY, BILATERAL MYRINGOTOMY AND TUBES     Social History   Tobacco Use   Smoking status: Former    Types: Cigarettes    Quit date: 10/30/1990    Years since quitting: 31.8   Smokeless tobacco: Never  Substance Use Topics   Alcohol use: No   Drug use: No   History reviewed. No pertinent family history. Allergies   Allergen Reactions   Shellfish Allergy    Sulfonamide Derivatives    Review of Systems  Musculoskeletal:  Positive for back pain and joint pain (Bilateral shoulder pain, left knee pain).  All other systems reviewed and are negative.     Objective:     BP 124/77   Pulse (!) 108   Ht 5' 5"$  (1.651 m)   Wt 122 lb 3.2 oz (55.4 kg)   LMP 03/22/2016   SpO2 98%   BMI 20.34 kg/m  BP Readings from Last 3 Encounters:  08/22/22 124/77  05/16/22 122/67  04/11/22 123/68   Physical Exam Vitals reviewed.  Constitutional:      General: She is not in acute distress.    Appearance: Normal appearance. She is not toxic-appearing.  HENT:     Head: Normocephalic and atraumatic.     Right Ear: External ear normal.     Left Ear: External ear normal.     Nose: Nose normal. No congestion or rhinorrhea.     Mouth/Throat:     Mouth: Mucous membranes are moist.     Pharynx: Oropharynx is clear. No oropharyngeal exudate or posterior oropharyngeal erythema.  Eyes:     General: No scleral icterus.    Extraocular Movements: Extraocular movements intact.     Conjunctiva/sclera:  Conjunctivae normal.     Pupils: Pupils are equal, round, and reactive to light.  Cardiovascular:     Rate and Rhythm: Normal rate and regular rhythm.     Pulses: Normal pulses.     Heart sounds: Normal heart sounds. No murmur heard.    No friction rub. No gallop.  Pulmonary:     Effort: Pulmonary effort is normal.     Breath sounds: Normal breath sounds. No wheezing, rhonchi or rales.  Abdominal:     General: Abdomen is flat. Bowel sounds are normal. There is no distension.     Palpations: Abdomen is soft.     Tenderness: There is no abdominal tenderness.  Musculoskeletal:        General: Tenderness (TTP over the patellar tendon of the left knee) present. No swelling. Normal range of motion.     Cervical back: Normal range of motion.     Right lower leg: No edema.     Left lower leg: No edema.  Lymphadenopathy:      Cervical: No cervical adenopathy.  Skin:    General: Skin is warm and dry.     Capillary Refill: Capillary refill takes less than 2 seconds.     Coloration: Skin is not jaundiced.  Neurological:     General: No focal deficit present.     Mental Status: She is alert and oriented to person, place, and time.  Psychiatric:        Mood and Affect: Mood normal.        Behavior: Behavior normal.   Last CBC Lab Results  Component Value Date   WBC 5.3 04/11/2022   HGB 13.3 04/11/2022   HCT 39.9 04/11/2022   MCV 91 04/11/2022   MCH 30.3 04/11/2022   RDW 12.2 04/11/2022   PLT 334 [id number]   Last metabolic panel Lab Results  Component Value Date   GLUCOSE 252 (H) 04/11/2022   NA 142 04/11/2022   K 4.1 04/11/2022   CL 104 04/11/2022   CO2 24 04/11/2022   BUN 11 04/11/2022   CREATININE 0.99 04/11/2022   EGFR 66 04/11/2022   CALCIUM 10.1 04/11/2022   PROT 7.0 04/11/2022   ALBUMIN 4.7 04/11/2022   LABGLOB 2.3 04/11/2022   AGRATIO 2.0 04/11/2022   BILITOT 0.4 04/11/2022   ALKPHOS 73 04/11/2022   AST 28 04/11/2022   ALT 31 04/11/2022   ANIONGAP 12 01/31/2020   Last lipids Lab Results  Component Value Date   CHOL 220 (H) 08/22/2022   HDL 59 08/22/2022   LDLCALC 137 (H) 08/22/2022   TRIG 136 08/22/2022   CHOLHDL 3.7 08/22/2022   Last hemoglobin A1c Lab Results  Component Value Date   HGBA1C 7.0 (H) 04/11/2022   Last thyroid functions Lab Results  Component Value Date   TSH 0.958 04/11/2022   Last vitamin D Lab Results  Component Value Date   VD25OH 25.8 (L) 08/22/2022   The 10-year ASCVD risk score (Arnett DK, et al., 2019) is: 5.5%    Assessment & Plan:   Problem List Items Addressed This Visit       Diabetes mellitus without complication ([place])    123456 7.0 in October.  Metformin was increased to 1000 mg twice daily at her last appointment. -No medication changes today -Repeat A1c and urine microalbumin/creatinine ratio at follow-up in 3  months -Previously referred for to ophthalmology for diabetic eye exam but has not scheduled an appointment.  She was reminded to do this today  Patellar tendinitis of left knee    She endorses a 2-week history of left knee pain today.  Her exam findings today are most consistent with patellar tendinitis of the left knee.  She has been provided with home PT exercises and instructed to take NSAIDs as needed for pain relief. -Follow-up if symptoms worsen or fail to improve      Hyperlipidemia - Primary    She is not currently on any cholesterol-lowering therapy.  Her lipid panel was updated in October.  Total cholesterol 198 and LDL 114.  Her 10-year ASCVD risk score today is 4.7%.  We have previously discussed the indication for statin therapy in the setting of diabetes with an LDL of 114 (goal LDL < 55).  She has declined statin therapy and preferred to focus on lifestyle modifications aimed at improving her cholesterol. -Repeat lipid panel today.  If there has not been any improvement, she has stated that she would be in agreement with starting statin therapy.      Breast mass seen on mammogram    Possible mass of the left breast was identified on screening mammogram in October 2023.  A diagnostic mammogram and left breast ultrasound were ordered but have not been completed.  Today Joy Bautista states that she does not plan to complete these imaging studies because of the cost and she states that the same thing has happened in the past.      Vitamin D insufficiency    Noted on labs from October.  She is currently on daily vitamin D supplementation. -Repeat vitamin D level ordered today       Return in about 3 months (around 11/20/2022).    Johnette Abraham, MD

## 2022-08-22 NOTE — Patient Instructions (Signed)
It was a pleasure to see you today.  Thank you for giving [place] the opportunity to be involved in your care.  Below is a brief recap of your visit and next steps.  We will plan to see you again in 3 months.  Summary Repeat cholesterol panel and vitamin D level today Please see the attached exercises for your back and left knee Follow up in 3 months

## 2022-08-23 LAB — VITAMIN D 25 HYDROXY (VIT D DEFICIENCY, FRACTURES): Vit D, 25-Hydroxy: 25.8 ng/mL — ABNORMAL LOW (ref 30.0–100.0)

## 2022-08-23 LAB — LIPID PANEL
Chol/HDL Ratio: 3.7 ratio (ref 0.0–4.4)
Cholesterol, Total: 220 mg/dL — ABNORMAL HIGH (ref 100–199)
HDL: 59 mg/dL (ref >39)
LDL Chol Calc (NIH): 137 mg/dL — ABNORMAL HIGH (ref 0–99)
Triglycerides: 136 mg/dL (ref 0–149)
VLDL Cholesterol Cal: 24 mg/dL (ref 5–40)

## 2022-08-26 ENCOUNTER — Encounter: Payer: Self-pay | Admitting: Internal Medicine

## 2022-08-28 ENCOUNTER — Other Ambulatory Visit: Payer: Self-pay | Admitting: Internal Medicine

## 2022-08-28 DIAGNOSIS — E559 Vitamin D deficiency, unspecified: Secondary | ICD-10-CM | POA: Insufficient documentation

## 2022-08-28 DIAGNOSIS — F339 Major depressive disorder, recurrent, unspecified: Secondary | ICD-10-CM

## 2022-08-28 DIAGNOSIS — F411 Generalized anxiety disorder: Secondary | ICD-10-CM

## 2022-08-28 DIAGNOSIS — G8929 Other chronic pain: Secondary | ICD-10-CM

## 2022-08-28 DIAGNOSIS — M7652 Patellar tendinitis, left knee: Secondary | ICD-10-CM | POA: Insufficient documentation

## 2022-08-28 NOTE — Assessment & Plan Note (Signed)
She endorses a 2-week history of left knee pain today.  Her exam findings today are most consistent with patellar tendinitis of the left knee.  She has been provided with home PT exercises and instructed to take NSAIDs as needed for pain relief. -Follow-up if symptoms worsen or fail to improve

## 2022-08-28 NOTE — Assessment & Plan Note (Signed)
Noted on labs from October.  She is currently on daily vitamin D supplementation. -Repeat vitamin D level ordered today

## 2022-08-28 NOTE — Assessment & Plan Note (Signed)
She is not currently on any cholesterol-lowering therapy.  Her lipid panel was updated in October.  Total cholesterol 198 and LDL 114.  Her 10-year ASCVD risk score today is 4.7%.  We have previously discussed the indication for statin therapy in the setting of diabetes with an LDL of 114 (goal LDL < 55).  She has declined statin therapy and preferred to focus on lifestyle modifications aimed at improving her cholesterol. -Repeat lipid panel today.  If there has not been any improvement, she has stated that she would be in agreement with starting statin therapy.

## 2022-08-28 NOTE — Assessment & Plan Note (Signed)
A1c 7.0 in October.  Metformin was increased to 1000 mg twice daily at her last appointment. -No medication changes today -Repeat A1c and urine microalbumin/creatinine ratio at follow-up in 3 months -Previously referred for to ophthalmology for diabetic eye exam but has not scheduled an appointment.  She was reminded to do this today

## 2022-08-28 NOTE — Assessment & Plan Note (Signed)
Possible mass of the left breast was identified on screening mammogram in October 2023.  A diagnostic mammogram and left breast ultrasound were ordered but have not been completed.  Today [name] states that she does not plan to complete these imaging studies because of the cost and she states that the same thing has happened in the past.

## 2022-11-03 ENCOUNTER — Encounter: Payer: Self-pay | Admitting: Internal Medicine

## 2022-11-03 DIAGNOSIS — M549 Dorsalgia, unspecified: Secondary | ICD-10-CM

## 2022-11-03 MED ORDER — CYCLOBENZAPRINE HCL 5 MG PO TABS: 5.0000 mg | ORAL_TABLET | Freq: Three times a day (TID) | ORAL | 0 refills | Status: AC | PRN

## 2022-11-11 ENCOUNTER — Other Ambulatory Visit: Payer: Self-pay | Admitting: Internal Medicine

## 2022-11-11 DIAGNOSIS — E119 Type 2 diabetes mellitus without complications: Secondary | ICD-10-CM

## 2022-11-28 ENCOUNTER — Ambulatory Visit: Payer: BC Managed Care – PPO | Admitting: Internal Medicine

## 2022-11-28 ENCOUNTER — Encounter: Payer: Self-pay | Admitting: Internal Medicine

## 2022-11-28 VITALS — BP 111/68 | HR 94 | Ht 65.0 in | Wt 120.2 lb

## 2022-11-28 DIAGNOSIS — Z7984 Long term (current) use of oral hypoglycemic drugs: Secondary | ICD-10-CM

## 2022-11-28 DIAGNOSIS — E119 Type 2 diabetes mellitus without complications: Secondary | ICD-10-CM

## 2022-11-28 DIAGNOSIS — E785 Hyperlipidemia, unspecified: Secondary | ICD-10-CM | POA: Diagnosis not present

## 2022-11-28 DIAGNOSIS — Z5320 Procedure and treatment not carried out because of patient's decision for unspecified reasons: Secondary | ICD-10-CM

## 2022-11-28 NOTE — Patient Instructions (Signed)
It was a pleasure to see you today.  Thank you for giving [place] the opportunity to be involved in your care.  Below is a brief recap of your visit and next steps.  We will plan to see you again in 6 months.   Summary No medication changes today Repeat labs ordered today We will follow up in 6 months

## 2022-11-28 NOTE — Progress Notes (Unsigned)
Established Patient Office Visit  Subjective   Patient ID: Joy Bautista, female    DOB: 1965/11/14  Age: 57 y.o. MRN: [id number]  Chief Complaint  Patient presents with   Diabetes    Follow up   Joy Bautista returns to care today for routine follow-up.  Last evaluated by me on 2/16.  No medication changes were made at that time.  There have been no acute interval events.  Joy Bautista reports feeling well today.  She is asymptomatic and has no acute concerns discussed.  She reports that she has made significant dietary changes in light of recent lab results.  She states that she has lost 6 pounds of body fat and has gained 5 pounds muscle.  She has been working with a nutritionist.  She is hopeful to see improvement in her labs today.  Past Medical History:  Diagnosis Date   Allergy    Anxiety    Arthritis    Breast cancer screening by mammogram 05/25/2020   Cataract    bilateral   Cervical cancer screening 01/25/2014   Chest pain    CTS (carpal tunnel syndrome)    Depression    Diabetes mellitus without complication (HCC)    Dizziness    Encounter to establish care 04/11/2022   Gout    Kidney stone 08/08/2019   Stone confirmed to be calcium oxalate.   SOB (shortness of breath)    Past Surgical History:  Procedure Laterality Date   CATARACT EXTRACTION     EYE SURGERY     Neck fusion C5-6     TONSILECTOMY, ADENOIDECTOMY, BILATERAL MYRINGOTOMY AND TUBES     Social History   Tobacco Use   Smoking status: Former    Types: Cigarettes    Quit date: 10/30/1990    Years since quitting: 32.1   Smokeless tobacco: Never  Substance Use Topics   Alcohol use: No   Drug use: No   History reviewed. No pertinent family history. Allergies  Allergen Reactions   Peanut-Containing Drug Products Diarrhea   Shellfish Allergy    Sulfonamide Derivatives    Review of Systems  Constitutional:  Negative for chills and fever.  HENT:  Negative for sore throat.   Respiratory:   Negative for cough and shortness of breath.   Cardiovascular:  Negative for chest pain, palpitations and leg swelling.  Gastrointestinal:  Negative for abdominal pain, blood in stool, constipation, diarrhea, nausea and vomiting.  Genitourinary:  Negative for dysuria and hematuria.  Musculoskeletal:  Negative for myalgias.  Skin:  Negative for itching and rash.  Neurological:  Negative for dizziness and headaches.  Psychiatric/Behavioral:  Negative for depression and suicidal ideas.      Objective:     BP 111/68   Pulse 94   Ht 5\' 5"  (1.651 m)   Wt 120 lb 3.2 oz (54.5 kg)   LMP 03/22/2016   SpO2 97%   BMI 20.00 kg/m  BP Readings from Last 3 Encounters:  11/28/22 111/68  08/22/22 124/77  05/16/22 122/67   Physical Exam Vitals reviewed.  Constitutional:      General: She is not in acute distress.    Appearance: Normal appearance. She is not toxic-appearing.  HENT:     Head: Normocephalic and atraumatic.     Right Ear: External ear normal.     Left Ear: External ear normal.     Nose: Nose normal. No congestion or rhinorrhea.     Mouth/Throat:     Mouth: Mucous membranes  are moist.     Pharynx: Oropharynx is clear. No oropharyngeal exudate or posterior oropharyngeal erythema.  Eyes:     General: No scleral icterus.    Extraocular Movements: Extraocular movements intact.     Conjunctiva/sclera: Conjunctivae normal.     Pupils: Pupils are equal, round, and reactive to light.  Cardiovascular:     Rate and Rhythm: Normal rate and regular rhythm.     Pulses: Normal pulses.     Heart sounds: Normal heart sounds. No murmur heard.    No friction rub. No gallop.  Pulmonary:     Effort: Pulmonary effort is normal.     Breath sounds: Normal breath sounds. No wheezing, rhonchi or rales.  Abdominal:     General: Abdomen is flat. Bowel sounds are normal. There is no distension.     Palpations: Abdomen is soft.     Tenderness: There is no abdominal tenderness.  Musculoskeletal:         General: No swelling. Normal range of motion.     Cervical back: Normal range of motion.     Right lower leg: No edema.     Left lower leg: No edema.  Lymphadenopathy:     Cervical: No cervical adenopathy.  Skin:    General: Skin is warm and dry.     Capillary Refill: Capillary refill takes less than 2 seconds.     Coloration: Skin is not jaundiced.  Neurological:     General: No focal deficit present.     Mental Status: She is alert and oriented to person, place, and time.  Psychiatric:        Mood and Affect: Mood normal.        Behavior: Behavior normal.   Last CBC Lab Results  Component Value Date   WBC 5.3 04/11/2022   HGB 13.3 04/11/2022   HCT 39.9 04/11/2022   MCV 91 04/11/2022   MCH 30.3 04/11/2022   RDW 12.2 04/11/2022   PLT 334 04/11/2022   Last metabolic panel Lab Results  Component Value Date   GLUCOSE 252 (H) 04/11/2022   NA 142 04/11/2022   K 4.1 04/11/2022   CL 104 04/11/2022   CO2 24 04/11/2022   BUN 11 04/11/2022   CREATININE 0.99 04/11/2022   EGFR 66 04/11/2022   CALCIUM 10.1 04/11/2022   PROT 7.0 04/11/2022   ALBUMIN 4.7 04/11/2022   LABGLOB 2.3 04/11/2022   AGRATIO 2.0 04/11/2022   BILITOT 0.4 04/11/2022   ALKPHOS 73 04/11/2022   AST 28 04/11/2022   ALT 31 04/11/2022   ANIONGAP 12 01/31/2020   Last lipids Lab Results  Component Value Date   CHOL 212 (H) 11/28/2022   HDL 60 11/28/2022   LDLCALC 136 (H) 11/28/2022   TRIG 91 11/28/2022   CHOLHDL 3.5 11/28/2022   Last hemoglobin A1c Lab Results  Component Value Date   HGBA1C 7.5 (H) 11/28/2022   Last thyroid functions Lab Results  Component Value Date   TSH 0.958 04/11/2022   Last vitamin D Lab Results  Component Value Date   VD25OH 25.8 (L) 08/22/2022   The 10-year ASCVD risk score (Arnett DK, et al., 2019) is: 4.3%    Assessment & Plan:   Problem List Items Addressed This Visit       Diabetes mellitus without complication (HCC) - Primary    A1c 7.0 in October  2023.  She is currently prescribed metformin XR 1000 mg daily. -Repeat A1c and urine microalbumin/creatinine ratio ordered today -Diabetic eye  exam recently completed at [facility].  Records requested today.      Hyperlipidemia    Lipid panel updated in February.  Total cholesterol 220 and LDL 137.  Her 10-year ASCVD risk is 4.5%.  She has previously declined to start statin therapy in the setting of diabetes and has preferred to focus on lifestyle modifications aimed) her cholesterol.  She reports today that she has made significant reviewed in light of recent lab results, losing 6% body fat and gained 5 pounds of muscle.  She is hopeful to see significant improvement in her cholesterol today. -Repeat lipid panel ordered today -I again reviewed with Joy Bautista the indication for statin therapy in the setting of diabetes mellitus.  She has declined statin therapy again and would like to see updated labs before further discussing.      Return in about 6 months (around 05/31/2023).   Billie Lade, MD

## 2022-11-29 LAB — MICROALBUMIN / CREATININE URINE RATIO

## 2022-11-29 LAB — LIPID PANEL
Cholesterol, Total: 212 mg/dL — ABNORMAL HIGH (ref 100–199)
LDL Chol Calc (NIH): 136 mg/dL — ABNORMAL HIGH (ref 0–99)
VLDL Cholesterol Cal: 16 mg/dL (ref 5–40)

## 2022-11-30 LAB — LIPID PANEL
Chol/HDL Ratio: 3.5 ratio (ref 0.0–4.4)
HDL: 60 mg/dL (ref >39)
Triglycerides: 91 mg/dL (ref 0–149)

## 2022-11-30 LAB — MICROALBUMIN / CREATININE URINE RATIO: Creatinine, Urine: 65.7 mg/dL

## 2022-11-30 LAB — HEMOGLOBIN A1C
Est. average glucose Bld gHb Est-mCnc: 169 mg/dL
Hgb A1c MFr Bld: 7.5 % — ABNORMAL HIGH (ref 4.8–5.6)

## 2022-12-04 NOTE — Assessment & Plan Note (Signed)
Lipid panel updated in February.  Total cholesterol 220 and LDL 137.  Her 10-year ASCVD risk is 4.5%.  She has previously declined to start statin therapy in the setting of diabetes and has preferred to focus on lifestyle modifications aimed) her cholesterol.  She reports today that she has made significant reviewed in light of recent lab results, losing 6% body fat and gained 5 pounds of muscle.  She is hopeful to see significant improvement in her cholesterol today. -Repeat lipid panel ordered today -I again reviewed with Joy Bautista the indication for statin therapy in the setting of diabetes mellitus.  She has declined statin therapy again and would like to see updated labs before further discussing.

## 2022-12-04 NOTE — Assessment & Plan Note (Signed)
A1c 7.0 in October 2023.  She is currently prescribed metformin XR 1000 mg daily. -Repeat A1c and urine microalbumin/creatinine ratio ordered today -Diabetic eye exam recently completed at [facility].  Records requested today.

## 2023-02-02 ENCOUNTER — Ambulatory Visit
Admission: EM | Admit: 2023-02-02 | Discharge: 2023-02-02 | Disposition: A | Discharge status: Home / Self Care | Payer: BC Managed Care – PPO | Attending: Nurse Practitioner | Admitting: Nurse Practitioner

## 2023-02-02 DIAGNOSIS — L282 Other prurigo: Secondary | ICD-10-CM | POA: Diagnosis not present

## 2023-02-02 MED ORDER — DEXAMETHASONE SODIUM PHOSPHATE 10 MG/ML IJ SOLN
10.0000 mg | INTRAMUSCULAR | Status: AC
  Administered 2023-02-02: 10 mg via INTRAMUSCULAR

## 2023-02-02 MED ORDER — TRIAMCINOLONE ACETONIDE 0.1 % EX CREA: 1.0000 | TOPICAL_CREAM | Freq: Two times a day (BID) | CUTANEOUS | 0 refills | Status: AC

## 2023-02-02 NOTE — ED Triage Notes (Signed)
Pt c/o insect bite to the right leg, pt states she was stung by a yellow jacket on Saturday evening, Sunday it started draining and swelling.

## 2023-02-02 NOTE — Discharge Instructions (Signed)
You were given an injection of Decadron 10 mg.  Continue to monitor your blood glucose levels as this medication will most likely elevate them.  If glucose exceeds 350-400, please go to the emergency department immediately. May take an over-the-counter antihistamine such as Zyrtec, Claritin, or Allegra during the day to help with itching.  Recommend Benadryl as directed at night. Avoid hot baths or showers while symptoms persist.  Recommend taking lukewarm baths. May apply cool cloths to the area to help with itching or discomfort. Avoid scratching, rubbing, or manipulating the areas while symptoms persist. If you develop worsening redness that goes up or down the leg, with foul-smelling drainage, fever, chills, or other concerns, please go to the emergency department immediately for further evaluation. Follow-up as needed.

## 2023-02-02 NOTE — ED Provider Notes (Signed)
[facility]    CSN: [id number] Arrival date & time: 02/02/23  1104      History   Chief Complaint No chief complaint on file.   HPI Joy Bautista is a 57 y.o. female.   The history is provided by the patient.   The patient presents for complaints of a yellowjacket sting to the right lower leg that occurred approximately 2 days ago.  Patient states that the area has increased in size, redness, and started draining.  She states that she does have some pain with ambulation.  She denies fever, chills, chest pain, abdominal pain, nausea, vomiting, or diarrhea.  She reports that she has used some over-the-counter medications for her symptoms with minimal relief including hydrocortisone cream.  Patient reports that she is diabetic.  Last A1c was 7.5 in May per labs seen in her chart.  Past Medical History:  Diagnosis Date   Allergy    Anxiety    Arthritis    Breast cancer screening by mammogram 05/25/2020   Cataract    bilateral   Cervical cancer screening 01/25/2014   Chest pain    CTS (carpal tunnel syndrome)    Depression    Diabetes mellitus without complication (HCC)    Dizziness    Encounter to establish care 04/11/2022   Gout    Kidney stone 08/08/2019   Stone confirmed to be calcium oxalate.   SOB (shortness of breath)     Patient Active Problem List   Diagnosis Date Noted   Vitamin D insufficiency 08/28/2022   Patellar tendinitis of left knee 08/28/2022   Left shoulder pain 05/16/2022   Breast mass seen on mammogram 05/16/2022   Steatorrhea 04/11/2022   Chronic musculoskeletal pain 04/11/2022   Encounter to establish care 04/11/2022   Epigastric pain 10/10/2021   Suprapubic pain 10/10/2021   Enuresis 09/05/2021   Chronic right shoulder pain 02/06/2021   Headache, drug induced 06/08/2020   Diabetes mellitus without complication (HCC) 04/13/2018   Tinnitus, bilateral 04/13/2018   Loud snoring 04/13/2018   Hyperlipidemia 04/13/2018    Generalized anxiety disorder 07/10/2010   Depression, recurrent (HCC) 07/10/2010   HEARING LOSS, LEFT EAR 07/10/2010   ALLERGIC RHINITIS 07/10/2010   MENOPAUSAL SYNDROME 07/10/2010    Past Surgical History:  Procedure Laterality Date   CATARACT EXTRACTION     EYE SURGERY     Neck fusion C5-6     TONSILECTOMY, ADENOIDECTOMY, BILATERAL MYRINGOTOMY AND TUBES      OB History   No obstetric history on file.      Home Medications    Prior to Admission medications   Medication Sig Start Date End Date Taking? Authorizing Provider  acetaminophen (ARTHRITIS PAIN APAP) 650 MG CR tablet Take 650 mg by mouth every 8 (eight) hours as needed for pain.    [provider]  Cholecalciferol (VITAMIN D) 50 MCG (2000 UT) CAPS Take 1 capsule by mouth daily.    [provider]  cyclobenzaprine (FLEXERIL) 5 MG tablet Take 1 tablet (5 mg total) by mouth 3 (three) times daily as needed for muscle spasms. 11/03/22   Billie Lade, MD  DULoxetine (CYMBALTA) 60 MG capsule Take 1 capsule (60 mg total) by mouth daily. 08/22/22 02/18/23  Billie Lade, MD  Melatonin 10 MG TABS Take 1 tablet by mouth as needed.    [provider]  meloxicam (MOBIC) 15 MG tablet Take 15 mg by mouth daily as needed for pain.    [provider]  metFORMIN (GLUCOPHAGE-XR) 500 MG 24 hr tablet TAKE 2 TABLETS(1000 MG) BY MOUTH DAILY WITH BREAKFAST 11/11/22   Billie Lade, MD  polyethylene glycol powder (GLYCOLAX/MIRALAX) 17 GM/SCOOP powder Take 17 g by mouth 2 (two) times daily as needed. 10/09/21   Valetta Close, MD    Family History History reviewed. No pertinent family history.  Social History Social History   Tobacco Use   Smoking status: Former    Current packs/day: 0.00    Types: Cigarettes    Quit date: 10/30/1990    Years since quitting: 32.2   Smokeless tobacco: Never  Substance Use Topics   Alcohol use: No   Drug use: No     Allergies   Peanut-containing drug  products, Shellfish allergy, and Sulfonamide derivatives   Review of Systems Review of Systems Per HPI  Physical Exam Triage Vital Signs ED Triage Vitals  Encounter Vitals Group     BP 02/02/23 1110 (!) 149/80     Systolic BP Percentile --      Diastolic BP Percentile --      Pulse Rate 02/02/23 1110 97     Resp 02/02/23 1110 14     Temp 02/02/23 1110 98.3 F (36.8 C)     Temp Source 02/02/23 1110 Oral     SpO2 02/02/23 1110 96 %     Weight --      Height --      Head Circumference --      Peak Flow --      Pain Score 02/02/23 1113 2     Pain Loc --      Pain Education --      Exclude from Growth Chart --    No data found.  Updated Vital Signs BP (!) 149/80 (BP Location: Right Arm)   Pulse 97   Temp 98.3 F (36.8 C) (Oral)   Resp 14   LMP 03/22/2016   SpO2 96%   Visual Acuity Right Eye Distance:   Left Eye Distance:   Bilateral Distance:    Right Eye Near:   Left Eye Near:    Bilateral Near:     Physical Exam Vitals and nursing note reviewed.  Constitutional:      General: She is not in acute distress.    Appearance: Normal appearance.  HENT:     Head: Normocephalic.  Eyes:     Extraocular Movements: Extraocular movements intact.     Pupils: Pupils are equal, round, and reactive to light.  Cardiovascular:     Rate and Rhythm: Normal rate and regular rhythm.     Pulses: Normal pulses.     Heart sounds: Normal heart sounds.  Pulmonary:     Effort: Pulmonary effort is normal. No respiratory distress.     Breath sounds: Normal breath sounds. No stridor. No wheezing, rhonchi or rales.  Musculoskeletal:     Cervical back: Normal range of motion.  Lymphadenopathy:     Cervical: No cervical adenopathy.  Skin:    General: Skin is warm and dry.       Neurological:     General: No focal deficit present.     Mental Status: She is alert and oriented to person, place, and time.  Psychiatric:        Mood and Affect: Mood normal.        Behavior:  Behavior normal.      UC Treatments / Results  Labs (all labs ordered are listed, but only abnormal results are  displayed) Labs Reviewed - No data to display  EKG   Radiology No results found.  Procedures Procedures (including critical care time)  Medications Ordered in UC Medications  dexamethasone (DECADRON) injection 10 mg (has no administration in time range)    Initial Impression / Assessment and Plan / UC Course  I have reviewed the triage vital signs and the nursing notes.  Pertinent labs & imaging results that were available during my care of the patient were reviewed by me and considered in my medical decision making (see chart for details).  The patient is well-appearing, she is in no acute distress, vital signs are stable.  Symptoms appear to be consistent with papular urticaria most likely caused by the insect bite.  Decadron 10 mg IM administered in the clinic to treat inflammation and swelling.  Triamcinolone cream 0.1% prescribed for continued inflammation, and itching.  Supportive care recommendations were provided and discussed with the patient to include cool compresses to the area, avoiding scratching or manipulating the area, and over-the-counter antihistamines to help with itching.  Patient was given follow-up precautions to include increased redness, swelling, and the development of foul-smelling drainage.  The patient was also advised to monitor blood glucose levels.  Patient was in agreement with this plan of care and verbalizes understanding.  All questions were answered.  Patient stable for discharge.  Final Clinical Impressions(s) / UC Diagnoses   Final diagnoses:  Papular urticaria     Discharge Instructions      You were given an injection of Decadron 10 mg.  Continue to monitor your blood glucose levels as this medication will most likely elevate them.  If glucose exceeds 350-400, please go to the emergency department immediately. May take an  over-the-counter antihistamine such as Zyrtec, Claritin, or Allegra during the day to help with itching.  Recommend Benadryl as directed at night. Avoid hot baths or showers while symptoms persist.  Recommend taking lukewarm baths. May apply cool cloths to the area to help with itching or discomfort. Avoid scratching, rubbing, or manipulating the areas while symptoms persist. If you develop worsening redness that goes up or down the leg, with foul-smelling drainage, fever, chills, or other concerns, please go to the emergency department immediately for further evaluation. Follow-up as needed.     ED Prescriptions   None    PDMP not reviewed this encounter.   Abran Cantor, NP 02/02/23 1143

## 2023-02-16 ENCOUNTER — Emergency Department (HOSPITAL_COMMUNITY): Payer: BC Managed Care – PPO

## 2023-02-16 ENCOUNTER — Other Ambulatory Visit: Payer: Self-pay

## 2023-02-16 ENCOUNTER — Emergency Department (HOSPITAL_COMMUNITY)
Admission: EM | Admit: 2023-02-16 | Discharge: 2023-02-16 | Disposition: A | Discharge status: Home / Self Care | Payer: BC Managed Care – PPO | Attending: Emergency Medicine | Admitting: Emergency Medicine

## 2023-02-16 ENCOUNTER — Encounter (HOSPITAL_COMMUNITY): Payer: Self-pay

## 2023-02-16 DIAGNOSIS — R072 Precordial pain: Secondary | ICD-10-CM | POA: Diagnosis not present

## 2023-02-16 DIAGNOSIS — R0789 Other chest pain: Secondary | ICD-10-CM | POA: Diagnosis not present

## 2023-02-16 DIAGNOSIS — E876 Hypokalemia: Secondary | ICD-10-CM | POA: Diagnosis not present

## 2023-02-16 DIAGNOSIS — R55 Syncope and collapse: Secondary | ICD-10-CM | POA: Insufficient documentation

## 2023-02-16 DIAGNOSIS — R079 Chest pain, unspecified: Secondary | ICD-10-CM | POA: Diagnosis not present

## 2023-02-16 LAB — URINALYSIS, ROUTINE W REFLEX MICROSCOPIC
Bilirubin Urine: NEGATIVE
Glucose, UA: NEGATIVE mg/dL
Hgb urine dipstick: NEGATIVE
Ketones, ur: NEGATIVE mg/dL
Leukocytes,Ua: NEGATIVE
Nitrite: NEGATIVE
Protein, ur: NEGATIVE mg/dL
Specific Gravity, Urine: 1.005 (ref 1.005–1.030)
pH: 6 (ref 5.0–8.0)

## 2023-02-16 LAB — BASIC METABOLIC PANEL
Anion gap: 14 (ref 5–15)
BUN: 12 mg/dL (ref 6–20)
CO2: 23 mmol/L (ref 22–32)
Calcium: 9.6 mg/dL (ref 8.9–10.3)
Chloride: 106 mmol/L (ref 98–111)
Creatinine, Ser: 0.84 mg/dL (ref 0.44–1.00)
GFR, Estimated: >60 mL/min (ref >60)
Glucose, Bld: 147 mg/dL — ABNORMAL HIGH (ref 70–99)
Potassium: 3.3 mmol/L — ABNORMAL LOW (ref 3.5–5.1)
Sodium: 143 mmol/L (ref 135–145)

## 2023-02-16 LAB — CBC
HCT: 40.9 % (ref 36.0–46.0)
Hemoglobin: 13.8 g/dL (ref 12.0–15.0)
MCH: 30.7 pg (ref 26.0–34.0)
MCHC: 33.7 g/dL (ref 30.0–36.0)
MCV: 90.9 fL (ref 80.0–100.0)
Platelets: 328 10*3/uL (ref 150–400)
RBC: 4.5 MIL/uL (ref 3.87–5.11)
RDW: 12.7 % (ref 11.5–15.5)
WBC: 7.9 10*3/uL (ref 4.0–10.5)
nRBC: 0 % (ref 0.0–0.2)

## 2023-02-16 LAB — CBG MONITORING, ED: Glucose-Capillary: 144 mg/dL — ABNORMAL HIGH (ref 70–99)

## 2023-02-16 LAB — TROPONIN I (HIGH SENSITIVITY): Troponin I (High Sensitivity): 2 ng/L (ref <18)

## 2023-02-16 MED ORDER — POTASSIUM CHLORIDE CRYS ER 20 MEQ PO TBCR
40.0000 meq | EXTENDED_RELEASE_TABLET | Freq: Once | ORAL | Status: AC
  Administered 2023-02-16: 40 meq via ORAL
  Filled 2023-02-16: qty 2

## 2023-02-16 MED ORDER — ACETAMINOPHEN 500 MG PO TABS
1000.0000 mg | ORAL_TABLET | Freq: Once | ORAL | Status: AC
  Administered 2023-02-16: 1000 mg via ORAL
  Filled 2023-02-16: qty 2

## 2023-02-16 NOTE — Discharge Instructions (Addendum)
It was our pleasure to provide your ER care today - we hope that you feel better.  Overall, your ER tests look good. Your potassium level is slightly low - eat plenty of fruits and vegetables, and follow up with primary care doctor.  Make sure to drink plenty of fluids/stay well hydrated.   For recent chest discomfort, follow up closely with cardiologist in the next 1-2 weeks. Also follow up with primary care doctor.   Return to ER if worse, new symptoms, fevers, recurrent/persistent chest pain, increased trouble breathing, fainting, new numbness/weakness, or other concern.

## 2023-02-16 NOTE — ED Provider Notes (Signed)
EMERGENCY DEPARTMENT AT [facility] Provider Note   CSN: [id number] Arrival date & time: 02/16/23  1401     History  Chief Complaint  Patient presents with   Near Syncope    Silver Willems is a 57 y.o. female.  Pt indicates earlier today at rest with feeling faint/lightheaded, and then indicates had a mild left sided chest discomfort and felt numb/tingling in left hand. Does note some recent left wrist pain and intermittent hand numbness, at rest, not necessarily associated with specific activity or repetitive use. No radicular neck pain. No arm or hand weakness or loss of normal function. Symptoms were at rest. No exertional chest pain or discomfort. No associated sob or unusual doe. No associated nv or diaphoresis. No palpitations. No other recent chest pain, even w exertion. No leg pain or swelling. No pleuritic pain. No syncope or loc. Has been eating/drinking. No recent change in meds. No wt loss. No fever or chills. No recent abnormal bleeding or blood loss.   The history is provided by the patient and medical records.  Near Syncope Associated symptoms include chest pain. Pertinent negatives include no abdominal pain, no headaches and no shortness of breath.       Home Medications Prior to Admission medications   Medication Sig Start Date End Date Taking? Authorizing Provider  acetaminophen (ARTHRITIS PAIN APAP) 650 MG CR tablet Take 650 mg by mouth every 8 (eight) hours as needed for pain.    [provider]  Cholecalciferol (VITAMIN D) 50 MCG (2000 UT) CAPS Take 1 capsule by mouth daily.    [provider]  cyclobenzaprine (FLEXERIL) 5 MG tablet Take 1 tablet (5 mg total) by mouth 3 (three) times daily as needed for muscle spasms. 11/03/22   Billie Lade, MD  DULoxetine (CYMBALTA) 60 MG capsule Take 1 capsule (60 mg total) by mouth daily. 08/22/22 02/18/23  Billie Lade, MD  Melatonin 10 MG TABS Take 1 tablet by mouth as needed.     [provider]  meloxicam (MOBIC) 15 MG tablet Take 15 mg by mouth daily as needed for pain.    [provider]  metFORMIN (GLUCOPHAGE-XR) 500 MG 24 hr tablet TAKE 2 TABLETS(1000 MG) BY MOUTH DAILY WITH BREAKFAST 11/11/22   Billie Lade, MD  polyethylene glycol powder (GLYCOLAX/MIRALAX) 17 GM/SCOOP powder Take 17 g by mouth 2 (two) times daily as needed. 10/09/21   Valetta Close, MD  triamcinolone cream (KENALOG) 0.1 % Apply 1 Application topically 2 (two) times daily. 02/02/23   Leath-Warren, Sadie Haber, NP      Allergies    Peanut-containing drug products, Shellfish allergy, and Sulfonamide derivatives    Review of Systems   Review of Systems  Constitutional:  Negative for chills and fever.  HENT:  Negative for sore throat.   Eyes:  Negative for visual disturbance.  Respiratory:  Negative for cough and shortness of breath.   Cardiovascular:  Positive for chest pain and near-syncope. Negative for palpitations and leg swelling.  Gastrointestinal:  Negative for abdominal pain, blood in stool, diarrhea and vomiting.  Genitourinary:  Negative for dysuria and flank pain.  Musculoskeletal:  Negative for back pain and neck pain.  Skin:  Negative for rash.  Neurological:  Positive for light-headedness. Negative for speech difficulty, weakness and headaches.  Hematological:  Does not bruise/bleed easily.  Psychiatric/Behavioral:  Negative for confusion.     Physical Exam Updated Vital Signs BP 125/74   Pulse 100  Temp 98.7 F (37.1 C) (Oral)   Resp 17   Ht 1.651 m (5\' 5" )   Wt 54 kg   LMP 03/22/2016   SpO2 98%   BMI 19.81 kg/m  Physical Exam Vitals and nursing note reviewed.  Constitutional:      Appearance: Normal appearance. She is well-developed.  HENT:     Head: Atraumatic.     Right Ear: Tympanic membrane normal.     Left Ear: Tympanic membrane normal.     Nose: Nose normal.     Mouth/Throat:     Mouth: Mucous membranes are moist.  Eyes:      General: No scleral icterus.    Conjunctiva/sclera: Conjunctivae normal.     Pupils: Pupils are equal, round, and reactive to light.  Neck:     Vascular: No carotid bruit.     Trachea: No tracheal deviation.  Cardiovascular:     Rate and Rhythm: Normal rate and regular rhythm.     Pulses: Normal pulses.     Heart sounds: Normal heart sounds. No murmur heard.    No friction rub. No gallop.  Pulmonary:     Effort: Pulmonary effort is normal. No respiratory distress.     Breath sounds: Normal breath sounds.  Abdominal:     General: There is no distension.     Palpations: Abdomen is soft. There is no mass.     Tenderness: There is no abdominal tenderness.  Genitourinary:    Comments: No cva tenderness.  Musculoskeletal:        General: No swelling or tenderness.     Cervical back: Normal range of motion and neck supple. No rigidity or tenderness. No muscular tenderness.     Right lower leg: No edema.     Left lower leg: No edema.     Comments: LUE swelling. LUE and hand of normal color and warmth. Radial pulse 2+. No current left hand numbness, hand is nvi. (?possible cts).   Skin:    General: Skin is warm and dry.     Findings: No rash.  Neurological:     Mental Status: She is alert.     Comments: Alert, speech normal. Motor/sens grossly intact bil. Left hand nvi w motor/sens fxn grossly intact.   Psychiatric:     Comments: Mildly anxious appearing.      ED Results / Procedures / Treatments   Labs (all labs ordered are listed, but only abnormal results are displayed) Results for orders placed or performed during the hospital encounter of 02/16/23  Basic metabolic panel  Result Value Ref Range   Sodium 143 135 - 145 mmol/L   Potassium 3.3 (L) 3.5 - 5.1 mmol/L   Chloride 106 98 - 111 mmol/L   CO2 23 22 - 32 mmol/L   Glucose, Bld 147 (H) 70 - 99 mg/dL   BUN 12 6 - 20 mg/dL   Creatinine, Ser 6.57 0.44 - 1.00 mg/dL   Calcium 9.6 8.9 - 84.6 mg/dL   GFR, Estimated >96 >29  mL/min   Anion gap 14 5 - 15  CBC  Result Value Ref Range   WBC 7.9 4.0 - 10.5 K/uL   RBC 4.50 3.87 - 5.11 MIL/uL   Hemoglobin 13.8 12.0 - 15.0 g/dL   HCT 52.8 41.3 - 24.4 %   MCV 90.9 80.0 - 100.0 fL   MCH 30.7 26.0 - 34.0 pg   MCHC 33.7 30.0 - 36.0 g/dL   RDW 01.0 27.2 - 53.6 %  Platelets 328 150 - 400 K/uL   nRBC 0.0 0.0 - 0.2 %  Urinalysis, Routine w reflex microscopic -Urine, Clean Catch  Result Value Ref Range   Color, Urine STRAW (A) YELLOW   APPearance CLEAR CLEAR   Specific Gravity, Urine 1.005 1.005 - 1.030   pH 6.0 5.0 - 8.0   Glucose, UA NEGATIVE NEGATIVE mg/dL   Hgb urine dipstick NEGATIVE NEGATIVE   Bilirubin Urine NEGATIVE NEGATIVE   Ketones, ur NEGATIVE NEGATIVE mg/dL   Protein, ur NEGATIVE NEGATIVE mg/dL   Nitrite NEGATIVE NEGATIVE   Leukocytes,Ua NEGATIVE NEGATIVE  CBG monitoring, ED  Result Value Ref Range   Glucose-Capillary 144 (H) 70 - 99 mg/dL  Troponin I (High Sensitivity)  Result Value Ref Range   Troponin I (High Sensitivity) 2 <18 ng/L     EKG EKG Interpretation Date/Time:  Monday February 16 2023 14:23:00 EDT Ventricular Rate:  100 PR Interval:  142 QRS Duration:  91 QT Interval:  346 QTC Calculation: 447 R Axis:   52  Text Interpretation: Sinus tachycardia No significant change since last tracing Confirmed by Cathren Laine ([id number]) on 02/16/2023 2:34:19 PM  Radiology DG Chest Port 1 View  Result Date: 02/16/2023 CLINICAL DATA:  Chest pain. EXAM: PORTABLE CHEST 1 VIEW COMPARISON:  None Available. FINDINGS: Clear lungs. Normal heart size and mediastinal contours. No pleural effusion or pneumothorax. Visualized bones and upper abdomen are unremarkable. IMPRESSION: No evidence of acute cardiopulmonary disease. Electronically Signed   By: Orvan Falconer M.D.   On: 02/16/2023 15:53    Procedures Procedures    Medications Ordered in ED Medications  acetaminophen (TYLENOL) tablet 1,000 mg (1,000 mg Oral Given 02/16/23 1533)  potassium  chloride SA (KLOR-CON M) CR tablet 40 mEq (40 mEq Oral Given 02/16/23 1554)    ED Course/ Medical Decision Making/ A&P                                 Medical Decision Making Problems Addressed: Hypokalemia: acute illness or injury Near syncope: acute illness or injury with systemic symptoms Precordial chest pain: acute illness or injury with systemic symptoms that poses a threat to life or bodily functions  Amount and/or Complexity of Data Reviewed External Data Reviewed: notes. Labs: ordered. Decision-making details documented in ED Course. Radiology: ordered and independent interpretation performed. Decision-making details documented in ED Course. ECG/medicine tests: ordered and independent interpretation performed. Decision-making details documented in ED Course.  Risk OTC drugs. Prescription drug management. Decision regarding hospitalization.   Iv ns. Continuous pulse ox and cardiac monitoring. Labs ordered/sent. Imaging ordered.   Differential diagnosis includes ACS, msk cp, gi cp, stress rxn, hypoglycemia, etc. Dispo decision including potential need for admission considered - will get labs and imaging and reassess.   Reviewed nursing notes and prior charts for additional history. External reports reviewed.   Cardiac monitor: sinus rhythm, rate 94.  Labs reviewed/interpreted by me - wbc and hgb normal. K sl low. Kcl po. Trop normal (trop sent multiple hours after symptom onset, normal/2, felt not c/w acs).   Po fluids/food.   Xrays reviewed/interpreted by me - no pna.   No recurrent chest pain. Pt breathing comfortably. Ht 88, rr 14, pulse ox 100%.   Pt currently appears stable for d/c.   Rec close pcp f/u.  Return precautions provided.          Final Clinical Impression(s) / ED Diagnoses Final diagnoses:  Precordial chest  pain  Near syncope  Hypokalemia    Rx / DC Orders ED Discharge Orders     None         Cathren Laine, MD 02/16/23  1850

## 2023-02-16 NOTE — ED Notes (Signed)
Pt given 2 warm blankets

## 2023-02-16 NOTE — ED Triage Notes (Signed)
Pt reports when she got out of her car today she felt dizzy and like she was going to to pass out.  Reports she then began to have a mild pain in her left chest/axilla area and has had left hand pain and numbness intermittently for the past few days so she thought she needed to get checked out since she is diabetic.

## 2023-02-16 NOTE — ED Notes (Signed)
Pt ate 1 cup of applesauce and drank of water with ease

## 2023-02-18 ENCOUNTER — Ambulatory Visit: Payer: BC Managed Care – PPO | Admitting: Family Medicine

## 2023-02-18 ENCOUNTER — Encounter: Payer: Self-pay | Admitting: Family Medicine

## 2023-02-18 ENCOUNTER — Telehealth: Payer: Self-pay | Admitting: Internal Medicine

## 2023-02-18 VITALS — BP 126/72 | HR 108 | Ht 65.0 in | Wt 120.0 lb

## 2023-02-18 DIAGNOSIS — M7652 Patellar tendinitis, left knee: Secondary | ICD-10-CM | POA: Diagnosis not present

## 2023-02-18 DIAGNOSIS — M79672 Pain in left foot: Secondary | ICD-10-CM

## 2023-02-18 DIAGNOSIS — M79642 Pain in left hand: Secondary | ICD-10-CM

## 2023-02-18 DIAGNOSIS — M722 Plantar fascial fibromatosis: Secondary | ICD-10-CM

## 2023-02-18 DIAGNOSIS — G8929 Other chronic pain: Secondary | ICD-10-CM

## 2023-02-18 DIAGNOSIS — M25561 Pain in right knee: Secondary | ICD-10-CM | POA: Insufficient documentation

## 2023-02-18 MED ORDER — PREDNISONE 20 MG PO TABS
40.0000 mg | ORAL_TABLET | Freq: Every day | ORAL | 0 refills | Status: AC
Start: 2023-02-18 — End: 2023-02-23

## 2023-02-18 NOTE — Telephone Encounter (Signed)
Patient is wanting to switch PCP from Dr Durwin Nora over to [place]. Please advise Thank you

## 2023-02-18 NOTE — Telephone Encounter (Signed)
That's fine

## 2023-02-18 NOTE — Patient Instructions (Addendum)
I appreciate the opportunity to provide care to you today!    Follow up: with pcp  -I have initiated a course of prednisone at 40 mg daily for 5 days. Please avoid using NSAID products, such as ibuprofen (e.g., Advil, Motrin), while on prednisone. Instead, I recommend taking Tylenol (acetaminophen) for any additional pain relief.    Plantar Fasciitis Rest - Rest your foot to help it heal. But don't completely stop being active. Doing that can lead to more pain and stiffness in the long run.  -Avoidance of aggravating factors, and reducing pressure on the heel with a prefabricated silicone insert  ?Ice your foot - Putting ice on your heel for 20 minutes up to 4 times a day might relieve pain. Put a thin towel between the ice and your skin. Icing and massaging your foot before exercise might also help.  ?Do special foot exercises - Certain exercises can help with heel pain  Heel raises,, seated calf stretch, ankle circles, toe curls and foot taping Do these exercises every day.  ?Take pain medicines - If your pain is severe, you can try taking over-the-counter pain medicines. Examples include ibuprofen (sample brand names: Advil, Motrin) and naproxen (sample brand name: Aleve).  ?Wear sturdy shoes - Sneakers with a lot of cushion and good arch and heel support are best. Shoes with rigid soles can also help. Adding padded or gel heel inserts to your shoes might help, too.Footwear should provide sufficient cushioning to reduce pressure on the heel  ?Wear splints at night - Some people feel better if they wear a splint while they sleep that keeps their foot straight. These splints are sold in pharmacies and medical supply stores.   Referrals today- orthopedic surgery   Attached with your AVS, you will find valuable resources for self-education. I highly recommend dedicating some time to thoroughly examine them.   Please continue to a heart-healthy diet and increase your physical activities. Try  to exercise for at least five days a week.   Nonpharmacological interventions for carpal tunnel syndrome (CTS) focus on alleviating symptoms and preventing further nerve compression. Here are some effective strategies:  Wrist Splinting Nighttime Use: Wear a wrist splint, particularly at night, to keep the wrist in a neutral position. This helps reduce pressure on the median nerve and prevents bending that can exacerbate symptoms. Ergonomic Adjustments Workstation Setup: Adjust your workstation to maintain a neutral wrist position. This includes the height and angle of your keyboard and chair to minimize strain during typing and other activities. Proper Techniques: Use ergonomic tools such as a split keyboard or a mouse designed to reduce wrist strain. Stretching and Strengthening Exercises Wrist Exercises: Perform exercises to stretch and strengthen the muscles and tendons around the carpal tunnel. Examples include wrist flexor and extensor stretches, and nerve gliding exercises. Hand and Wrist Exercises: Engage in exercises that strengthen the muscles in your hands and forearms to support wrist stability.  Activity Modification Reduce Repetitive Movements: Take frequent breaks from activities that involve repetitive wrist movements. Modify tasks or adjust techniques to minimize strain on the wrists. Pace Yourself: Alternate between tasks that require intense wrist use and those that do not, to reduce cumulative strain. Heat and Cold Therapy Cold Packs: Apply cold packs to the affected area to reduce inflammation and numb the area. Heat Therapy: Use warm compresses or heating pads to relax tight muscles and improve blood flow to the wrist. Massage Therapy Gentle Massage: Use gentle massage techniques on the wrist  and forearm to alleviate muscle tension and improve circulation. Postural Correction Maintain Good Posture: Ensure proper posture while sitting or standing to reduce overall  strain on the wrists. Use proper body mechanics to support wrist health. Avoiding Trigger Factors Identifying Triggers: Be aware of activities or positions that exacerbate symptoms and adjust accordingly. Hand Protection Use of Padding: When engaging in activities that might impact the wrist, use protective padding or supports to reduce stress on the carpal tunnel.  It was a pleasure to see you and I look forward to continuing to work together on your health and well-being. Please do not hesitate to call the office if you need care or have questions about your care.  In case of emergency, please visit the Emergency Department for urgent care, or contact our clinic at [phone] to schedule an appointment. We're here to help you!   Have a wonderful day and week. With Gratitude, Gilmore Laroche MSN, FNP-BC

## 2023-02-18 NOTE — Progress Notes (Signed)
Acute Office Visit  Subjective:    Patient ID: Joy Bautista, female    DOB: 05-14-1966, 57 y.o.   MRN: [id number]  Chief Complaint  Patient presents with   Foot Pain    Pt reports foot arch pain, increased pain at night makes it hard to walk, ongoing for 2 months.    Knee Pain    Pt reports left knee pain. Uses brace, ongoing for a while now.   Hand Pain    Pt reports left hand pain radiating up to elbow, started on 02/13/2023.   HPI Patient is in today with c/o chronic foot pain, knee pain, hip pain.  She has been implementing conservative management with no relief. For the details of today's visit, please refer to the assessment and plan.     Past Medical History:  Diagnosis Date   Allergy    Anxiety    Arthritis    Breast cancer screening by mammogram 05/25/2020   Cataract    bilateral   Cervical cancer screening 01/25/2014   Chest pain    CTS (carpal tunnel syndrome)    Depression    Diabetes mellitus without complication (HCC)    Dizziness    Encounter to establish care 04/11/2022   Gout    Kidney stone 08/08/2019   Stone confirmed to be calcium oxalate.   SOB (shortness of breath)     Past Surgical History:  Procedure Laterality Date   CATARACT EXTRACTION     EYE SURGERY     Neck fusion C5-6     TONSILECTOMY, ADENOIDECTOMY, BILATERAL MYRINGOTOMY AND TUBES      History reviewed. No pertinent family history.  Social History   Socioeconomic History   Marital status: Divorced    Spouse name: Not on file   Number of children: Not on file   Years of education: Not on file   Highest education level: Some college, no degree  Occupational History   Not on file  Tobacco Use   Smoking status: Former    Current packs/day: 0.00    Types: Cigarettes    Quit date: 10/30/1990    Years since quitting: 32.3   Smokeless tobacco: Never  Vaping Use   Vaping status: Never Used  Substance and Sexual Activity   Alcohol use: No   Drug use: No   Sexual  activity: Not Currently  Other Topics Concern   Not on file  Social History Narrative   Not on file   Social Determinants of Health   Financial Resource Strain: Low Risk  (11/24/2022)   Overall Financial Resource Strain (CARDIA)    Difficulty of Paying Living Expenses: Not very hard  Food Insecurity: Food Insecurity Present (11/24/2022)   Hunger Vital Sign    Worried About Running Out of Food in the Last Year: Sometimes true    Ran Out of Food in the Last Year: Never true  Transportation Needs: No Transportation Needs (11/24/2022)   PRAPARE - Administrator, Civil Service (Medical): No    Lack of Transportation (Non-Medical): No  Physical Activity: Insufficiently Active (11/24/2022)   Exercise Vital Sign    Days of Exercise per Week: 3 days    Minutes of Exercise per Session: 30 min  Stress: No Stress Concern Present (11/24/2022)   [employer] of Occupational Health - Occupational Stress Questionnaire    Feeling of Stress : Not at all  Social Connections: Moderately Isolated (11/24/2022)   Social Connection and Isolation Panel [NHANES]  Frequency of Communication with Friends and Family: Never    Frequency of Social Gatherings with Friends and Family: Twice a week    Attends Religious Services: More than 4 times per year    Active Member of [employer] or Organizations: Yes    Attends Engineer, structural: More than 4 times per year    Marital Status: Divorced  Catering manager Violence: Not on file    Outpatient Medications Prior to Visit  Medication Sig Dispense Refill   acetaminophen (ARTHRITIS PAIN APAP) 650 MG CR tablet Take 650 mg by mouth every 8 (eight) hours as needed for pain.     Cholecalciferol (VITAMIN D) 50 MCG (2000 UT) CAPS Take 1 capsule by mouth daily.     cyclobenzaprine (FLEXERIL) 5 MG tablet Take 1 tablet (5 mg total) by mouth 3 (three) times daily as needed for muscle spasms. 30 tablet 0   DULoxetine (CYMBALTA) 60 MG capsule Take 1  capsule (60 mg total) by mouth daily. 90 capsule 1   Melatonin 10 MG TABS Take 1 tablet by mouth as needed.     meloxicam (MOBIC) 15 MG tablet Take 15 mg by mouth daily as needed for pain.     metFORMIN (GLUCOPHAGE-XR) 500 MG 24 hr tablet TAKE 2 TABLETS(1000 MG) BY MOUTH DAILY WITH BREAKFAST 180 tablet 1   polyethylene glycol powder (GLYCOLAX/MIRALAX) 17 GM/SCOOP powder Take 17 g by mouth 2 (two) times daily as needed. 3350 g 1   triamcinolone cream (KENALOG) 0.1 % Apply 1 Application topically 2 (two) times daily. 30 g 0   No facility-administered medications prior to visit.    Allergies  Allergen Reactions   Peanut-Containing Drug Products Diarrhea   Shellfish Allergy    Sulfonamide Derivatives     Review of Systems  Constitutional:  Negative for chills and fever.  Eyes:  Negative for visual disturbance.  Respiratory:  Negative for chest tightness and shortness of breath.   Musculoskeletal:        Foot, knee and hand pain  Neurological:  Negative for dizziness and headaches.       Objective:    Physical Exam HENT:     Head: Normocephalic.     Mouth/Throat:     Mouth: Mucous membranes are moist.  Cardiovascular:     Rate and Rhythm: Normal rate.     Pulses:          Posterior tibial pulses are 2+ on the right side and 2+ on the left side.     Heart sounds: Normal heart sounds.  Pulmonary:     Effort: Pulmonary effort is normal.     Breath sounds: Normal breath sounds.  Musculoskeletal:     Right hand: No swelling or deformity. Normal range of motion. Normal sensation.     Left hand: No swelling or deformity. Normal range of motion. Normal sensation.     Right knee: No swelling, deformity, effusion, erythema or ecchymosis. Normal range of motion.     Left knee: No swelling, deformity, effusion or erythema. Normal range of motion.     Right foot: Normal range of motion. No deformity, bunion, Charcot foot or foot drop.     Left foot: Normal range of motion. No deformity,  bunion, Charcot foot or foot drop.     Comments: Decreased hand grip in the right hand  Feet:     Right foot:     Skin integrity: No skin breakdown, erythema, warmth or callus.     Left  foot:     Skin integrity: No skin breakdown, erythema, warmth or callus.  Neurological:     Mental Status: She is alert.     BP 126/72   Pulse (!) 108   Ht 5\' 5"  (1.651 m)   Wt 120 lb 0.6 oz (54.4 kg)   LMP 03/22/2016   SpO2 98%   BMI 19.98 kg/m  Wt Readings from Last 3 Encounters:  02/18/23 120 lb 0.6 oz (54.4 kg)  02/16/23 119 lb 0.8 oz (54 kg)  11/28/22 120 lb 3.2 oz (54.5 kg)       Assessment & Plan:  Patellar tendinitis of left knee Assessment & Plan: chronic issue that has not resolved with nonpharmacologic interventions C/o left knee pain, which has been treated with physical therapy and NSAIDs, with minimal relief No systemic symptoms are reported.  Today, she was treated with corticosteroids. I advised her not to take NSAIDs while on steroids and reviewed nonpharmacologic interventions     Orders: -     predniSONE; Take 2 tablets (40 mg total) by mouth daily with breakfast for 5 days.  Dispense: 10 tablet; Refill: 0  Plantar fasciitis -     Ambulatory referral to Orthopedic Surgery  Foot pain, left Assessment & Plan: Ongoing for 2 months C/o pain at the bottom of her foot starting at the arch, radiating to the heel Pain is worse after prolong standing and increased activity No pain reported today No recent trauma or injury reported ROM is intact No deformity seen Likely plantar fasciitis Provided nonpharmacologic interventions Plantar Fasciitis Rest - Rest your foot to help it heal. But don't completely stop being active. Doing that can lead to more pain and stiffness in the long run.  -Avoidance of aggravating factors, and reducing pressure on the heel with a prefabricated silicone insert  ?Ice your foot - Putting ice on your heel for 20 minutes up to 4 times a day  might relieve pain. Put a thin towel between the ice and your skin. Icing and massaging your foot before exercise might also help.  ?Do special foot exercises - Certain exercises can help with heel pain  Heel raises,, seated calf stretch, ankle circles, toe curls and foot taping Do these exercises every day.  ?Take pain medicines - If your pain is severe, you can try taking over-the-counter pain medicines. Examples include ibuprofen (sample brand names: Advil, Motrin) and naproxen (sample brand name: Aleve).  ?Wear sturdy shoes - Sneakers with a lot of cushion and good arch and heel support are best. Shoes with rigid soles can also help. Adding padded or gel heel inserts to your shoes might help, too.Footwear should provide sufficient cushioning to reduce pressure on the heel  ?Wear splints at night - Some people feel better if they wear a splint while they sleep that keeps their foot straight. These splints are sold in pharmacies and medical supply stores.   Referrals to orthopedic surgery     Hand pain, left Assessment & Plan: Onset of symptom on 02/13/2023 C/o numbness and tingling with severe pain when she preforms repetitive movements No pain today No swelling or stiffness reported No recent injury or trauma reported ROM is intact No deformity seen Likely carpal tunnel Encouraged to take otc tylenol as needed Referral placed to orthopedics Reviewed nonpharmacologic interventions including wrist splinting, performing stretching and strengthening exercises and activity modification   Note: This chart has been completed using Engineer, civil (consulting) software, and while attempts have been made to  ensure accuracy, certain words and phrases may not be transcribed as intended.    Gilmore Laroche, FNP

## 2023-02-18 NOTE — Assessment & Plan Note (Signed)
Ongoing for 2 months C/o pain at the bottom of her foot starting at the arch, radiating to the heel Pain is worse after prolong standing and increased activity No pain reported today No recent trauma or injury reported ROM is intact No deformity seen Likely plantar fasciitis Provided nonpharmacologic interventions Plantar Fasciitis Rest - Rest your foot to help it heal. But don't completely stop being active. Doing that can lead to more pain and stiffness in the long run.  -Avoidance of aggravating factors, and reducing pressure on the heel with a prefabricated silicone insert  ?Ice your foot - Putting ice on your heel for 20 minutes up to 4 times a day might relieve pain. Put a thin towel between the ice and your skin. Icing and massaging your foot before exercise might also help.  ?Do special foot exercises - Certain exercises can help with heel pain  Heel raises,, seated calf stretch, ankle circles, toe curls and foot taping Do these exercises every day.  ?Take pain medicines - If your pain is severe, you can try taking over-the-counter pain medicines. Examples include ibuprofen (sample brand names: Advil, Motrin) and naproxen (sample brand name: Aleve).  ?Wear sturdy shoes - Sneakers with a lot of cushion and good arch and heel support are best. Shoes with rigid soles can also help. Adding padded or gel heel inserts to your shoes might help, too.Footwear should provide sufficient cushioning to reduce pressure on the heel  ?Wear splints at night - Some people feel better if they wear a splint while they sleep that keeps their foot straight. These splints are sold in pharmacies and medical supply stores.   Referrals to orthopedic surgery

## 2023-02-19 NOTE — Telephone Encounter (Signed)
Called patient left voicemail to contact our office to schedule and sent mychart message to contact office.

## 2023-02-21 NOTE — Assessment & Plan Note (Signed)
Onset of symptom on 02/13/2023 C/o numbness and tingling with severe pain when she preforms repetitive movements No pain today No swelling or stiffness reported No recent injury or trauma reported ROM is intact No deformity seen Likely carpal tunnel Encouraged to take otc tylenol as needed Referral placed to orthopedics Reviewed nonpharmacologic interventions including wrist splinting, performing stretching and strengthening exercises and activity modification

## 2023-02-21 NOTE — Assessment & Plan Note (Addendum)
chronic issue that has not resolved with nonpharmacologic interventions C/o left knee pain, which has been treated with physical therapy and NSAIDs, with minimal relief No systemic symptoms are reported.  Today, she was treated with corticosteroids. I advised her not to take NSAIDs while on steroids and reviewed nonpharmacologic interventions

## 2023-02-25 ENCOUNTER — Other Ambulatory Visit: Payer: Self-pay | Admitting: Internal Medicine

## 2023-02-25 ENCOUNTER — Encounter: Payer: Self-pay | Admitting: Orthopedic Surgery

## 2023-02-25 ENCOUNTER — Ambulatory Visit: Payer: BC Managed Care – PPO | Admitting: Orthopedic Surgery

## 2023-02-25 ENCOUNTER — Other Ambulatory Visit (INDEPENDENT_AMBULATORY_CARE_PROVIDER_SITE_OTHER): Payer: BC Managed Care – PPO

## 2023-02-25 VITALS — BP 145/75 | HR 101 | Ht 65.0 in | Wt 120.0 lb

## 2023-02-25 DIAGNOSIS — F339 Major depressive disorder, recurrent, unspecified: Secondary | ICD-10-CM

## 2023-02-25 DIAGNOSIS — F411 Generalized anxiety disorder: Secondary | ICD-10-CM

## 2023-02-25 DIAGNOSIS — M79671 Pain in right foot: Secondary | ICD-10-CM

## 2023-02-25 DIAGNOSIS — G8929 Other chronic pain: Secondary | ICD-10-CM

## 2023-02-25 DIAGNOSIS — G5602 Carpal tunnel syndrome, left upper limb: Secondary | ICD-10-CM | POA: Diagnosis not present

## 2023-02-25 NOTE — Progress Notes (Signed)
New Patient Visit  Assessment: Joy Bautista is a 57 y.o. female with the following: 1.  Right knee pain 2.  Left carpal tunnel syndrome, acute    Plan: Barton Fanny multiple complaints, which we discussed in clinic today.  Her primary complaint was her right heel.  No specific injury.  She has been having tenderness over the heel for the past 2-3 months.  Small bone spur on radiographs.  Could be related to plantar fasciitis or some fatty atrophy and irritation of the heel pad.  We discussed multiple options at this point to treat the symptoms.  She can consider massaging with ice, NSAIDs, shoe wear when inside, and I provided her with some simple exercises.  If she continues to have issues, we have multiple treatment options.  She is also having pain, burning, numbness and tingling in the left hand.  This is consistent with carpal tunnel, although she does state that she has some symptoms into the small finger.  On physical exam, the median nerve is irritated.  She will continue using a brace at nighttime.  Medications could be helpful.  We also discussed a prednisone Dosepak, but she is not interested.  If she continues to have issues, she will contact clinic for follow-up.  Follow-up: Return if symptoms worsen or fail to improve.  Subjective:  Chief Complaint  Patient presents with   Foot Pain    R foot pain for 2 mos getting worse. Pt states she does a lot of hiking and has pain that starts in the arch and moves into the heel    Hand Pain    Bilat hand pain L > R with weakness in hand and numbness in fingers for the past 2 wks. 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     History of Present Illness: Joy Bautista is a 57 y.o. female who has been referred by Gilmore Laroche, FNP for evaluation of right foot pain.  She has had pain in the heel of the right foot for the past 2-3 months.  No specific injury.  She likes to hike.  Towards the end of her hikes, she notes that she  has pain.  Sometimes it is unbearable.  Occasionally, she has severe pain when she wakes up in the morning.  She notes it is very painful when she walks on a hard surface.  She has not been take any medicines.  No activities.  No therapies.  She is also complaining of numbness in the left hand.  Approximately 2 weeks ago, she had acute onset of burning pain into her fingers.  She states it involves all fingers.  She did have some radiating pains into the forearm.  She does have a history of neck surgery.  She has been using a wrist brace at nighttime, and a different brace at work throughout the day.  The severe pain has improved, but she has some residual numbness and tingling.  She has not been taking medicines on a consistent basis.   Review of Systems: No fevers or chills + numbness or tingling No chest pain No shortness of breath No bowel or bladder dysfunction No GI distress No headaches   Medical History:  Past Medical History:  Diagnosis Date   Allergy    Anxiety    Arthritis    Breast cancer screening by mammogram 05/25/2020   Cataract    bilateral   Cervical cancer screening 01/25/2014   Chest pain  CTS (carpal tunnel syndrome)    Depression    Diabetes mellitus without complication (HCC)    Dizziness    Encounter to establish care 04/11/2022   Gout    Kidney stone 08/08/2019   Stone confirmed to be calcium oxalate.   SOB (shortness of breath)     Past Surgical History:  Procedure Laterality Date   CATARACT EXTRACTION     EYE SURGERY     Neck fusion C5-6     TONSILECTOMY, ADENOIDECTOMY, BILATERAL MYRINGOTOMY AND TUBES      No family history on file. Social History   Tobacco Use   Smoking status: Former    Current packs/day: 0.00    Types: Cigarettes    Quit date: 10/30/1990    Years since quitting: 32.3   Smokeless tobacco: Never  Vaping Use   Vaping status: Never Used  Substance Use Topics   Alcohol use: No   Drug use: No    Allergies   Allergen Reactions   Peanut-Containing Drug Products Diarrhea   Shellfish Allergy    Sulfonamide Derivatives     Current Meds  Medication Sig   acetaminophen (ARTHRITIS PAIN APAP) 650 MG CR tablet Take 650 mg by mouth every 8 (eight) hours as needed for pain.   Cholecalciferol (VITAMIN D) 50 MCG (2000 UT) CAPS Take 1 capsule by mouth daily.   cyclobenzaprine (FLEXERIL) 5 MG tablet Take 1 tablet (5 mg total) by mouth 3 (three) times daily as needed for muscle spasms.   Melatonin 10 MG TABS Take 1 tablet by mouth as needed.   meloxicam (MOBIC) 15 MG tablet Take 15 mg by mouth daily as needed for pain.   metFORMIN (GLUCOPHAGE-XR) 500 MG 24 hr tablet TAKE 2 TABLETS(1000 MG) BY MOUTH DAILY WITH BREAKFAST   polyethylene glycol powder (GLYCOLAX/MIRALAX) 17 GM/SCOOP powder Take 17 g by mouth 2 (two) times daily as needed.   triamcinolone cream (KENALOG) 0.1 % Apply 1 Application topically 2 (two) times daily.    Objective: BP (!) 145/75   Pulse (!) 101   Ht 5\' 5"  (1.651 m)   Wt 120 lb (54.4 kg)   LMP 03/22/2016   BMI 19.97 kg/m   Physical Exam:  General: Alert and oriented. and No acute distress. Gait: Left sided antalgic gait.  Evaluation left foot demonstrates no bruising.  No swelling.  No redness.  She is tenderness palpation along the plantar fascia, as well as the heel.  She tolerates dorsiflexion beyond neutral.  Toes are warm and well-perfused.  Sensation intact to the dorsum of the foot.  Evaluation left hand demonstrates no bruising.  No swelling.  No redness.  Positive Tinel's.  Positive Phalen's.  Positive Durkan's.  Decree sensation in the small finger.  Negative Tinel's at the cubital tunnel.   IMAGING: I personally ordered and reviewed the following images  X-rays of the right foot were obtained in clinic today.  These are nonweightbearing views.  No acute injuries are noted.  Minimal degenerative changes.  Good overall alignment.  Small bone spur on the plantar  aspect of the calcaneus.  Impression: Negative right foot x-ray with small bone spur plantar calcaneus   New Medications:  No orders of the defined types were placed in this encounter.     Oliver Barre, MD  02/25/2023 8:59 AM

## 2023-02-25 NOTE — Patient Instructions (Signed)
Plantar Fasciitis Rehab Ask your health care provider which exercises are safe for you. Do exercises exactly as told by your health care provider and adjust them as directed. It is normal to feel mild stretching, pulling, tightness, or discomfort as you do these exercises. Stop right away if you feel sudden pain or your pain gets worse. Do not begin these exercises until told by your health care provider.   Stretching and range-of-motion exercises These exercises warm up your muscles and joints and improve the movement and flexibility of your foot. These exercises also help to relieve pain.   Plantar fascia stretch  Sit with your left / right leg crossed over your opposite knee. Hold your heel with one hand with that thumb near your arch. With your other hand, hold your toes and gently pull them back toward the top of your foot. You should feel a stretch on the base (bottom) of your toes, or the bottom of your foot (plantar fascia), or both. Hold this stretch 10 seconds. Slowly release your toes and return to the starting position. Repeat 10 times. Complete this exercise 1-2 times a day.   Gastrocnemius stretch, standing This exercise is also called a calf (gastroc) stretch. It stretches the muscles in the back of the upper calf. Stand with your hands against a wall. Extend your left / right leg behind you, and bend your front knee slightly. Keeping your heels on the floor, your toes facing forward, and your back knee straight, shift your weight toward the wall. Do not arch your back. You should feel a gentle stretch in your upper calf. Hold this position for 10 seconds. Repeat 10 times. Complete this exercise 1-2 times a day.   Soleus stretch, standing This exercise is also called a calf (soleus) stretch. It stretches the muscles in the back of the lower calf. Stand with your hands against a wall. Extend your left / right leg behind you, and bend your front knee slightly. Keeping your  heels on the floor and your toes facing forward, bend your back knee and shift your weight slightly over your back leg. You should feel a gentle stretch deep in your lower calf. Hold this position for 10 seconds. Repeat 10 times. Complete this exercise 1-2 times a day.   Gastroc and soleus stretch, standing step This exercise stretches the muscles in the back of the lower leg. These muscles are in the upper calf (gastrocnemius) and the lower calf (soleus). Stand with the ball of your left / right foot on the front of a step. The ball of your foot is on the walking surface, right under your toes. Keep your other foot firmly on the same step. Hold on to the wall or a railing for balance. Slowly lift your other foot, allowing your body weight to press your heel down over the edge of the front of the step. Keep knee straight and unbent. You should feel a stretch in your calf. Hold this position for 10 seconds. Return both feet to the step. Repeat this exercise with a slight bend in your left / right knee. Repeat 10 times with your left / right knee straight and 10 times with your left / right knee bent. Complete this exercise 1-2 times a day.   Balance exercise This exercise builds your balance and strength control of your arch to help take pressure off your plantar fascia.   Single leg stand If this exercise is too easy, you can try it with   your eyes closed or while standing on a pillow. Without shoes, stand near a railing or in a doorway. You may hold on to the railing or door frame as needed. Stand on your left / right foot. Keep your big toe down on the floor and lift the arch of your foot. You should feel a stretch across the bottom of your foot and your arch. Do not let your foot roll inward. Hold this position for 10 seconds. Repeat 10 times. Complete this exercise 1-2 times a day. This information is not intended to replace advice given to you by your health care provider. Make sure  you discuss any questions you have with your health care provider.   

## 2023-03-31 ENCOUNTER — Encounter: Payer: Self-pay | Admitting: Family Medicine

## 2023-04-25 DIAGNOSIS — S63521A Sprain of radiocarpal joint of right wrist, initial encounter: Secondary | ICD-10-CM | POA: Diagnosis not present

## 2023-04-27 DIAGNOSIS — M25531 Pain in right wrist: Secondary | ICD-10-CM | POA: Diagnosis not present

## 2023-04-27 DIAGNOSIS — M25511 Pain in right shoulder: Secondary | ICD-10-CM | POA: Diagnosis not present

## 2023-05-05 DIAGNOSIS — M25531 Pain in right wrist: Secondary | ICD-10-CM | POA: Diagnosis not present

## 2023-05-29 ENCOUNTER — Ambulatory Visit: Payer: BC Managed Care – PPO | Admitting: Family Medicine

## 2023-06-02 NOTE — Telephone Encounter (Signed)
Care team updated and letter sent for eye exam notes.

## 2023-08-24 ENCOUNTER — Other Ambulatory Visit: Payer: Self-pay | Admitting: Family Medicine

## 2023-08-24 DIAGNOSIS — F339 Major depressive disorder, recurrent, unspecified: Secondary | ICD-10-CM

## 2023-08-24 DIAGNOSIS — G8929 Other chronic pain: Secondary | ICD-10-CM

## 2023-08-24 DIAGNOSIS — F411 Generalized anxiety disorder: Secondary | ICD-10-CM

## 2023-08-25 ENCOUNTER — Encounter: Payer: BC Managed Care – PPO | Admitting: Family Medicine

## 2023-08-27 ENCOUNTER — Encounter: Payer: BC Managed Care – PPO | Admitting: Family Medicine

## 2023-09-02 DIAGNOSIS — M545 Low back pain, unspecified: Secondary | ICD-10-CM | POA: Insufficient documentation

## 2023-09-02 DIAGNOSIS — M791 Myalgia, unspecified site: Secondary | ICD-10-CM | POA: Diagnosis not present

## 2023-09-10 DIAGNOSIS — M545 Low back pain, unspecified: Secondary | ICD-10-CM | POA: Diagnosis not present

## 2023-09-15 DIAGNOSIS — M545 Low back pain, unspecified: Secondary | ICD-10-CM | POA: Diagnosis not present

## 2023-09-17 DIAGNOSIS — M545 Low back pain, unspecified: Secondary | ICD-10-CM | POA: Diagnosis not present

## 2023-09-22 DIAGNOSIS — M545 Low back pain, unspecified: Secondary | ICD-10-CM | POA: Diagnosis not present

## 2023-09-24 DIAGNOSIS — M545 Low back pain, unspecified: Secondary | ICD-10-CM | POA: Diagnosis not present

## 2023-09-29 DIAGNOSIS — M545 Low back pain, unspecified: Secondary | ICD-10-CM | POA: Diagnosis not present

## 2023-10-02 DIAGNOSIS — M545 Low back pain, unspecified: Secondary | ICD-10-CM | POA: Diagnosis not present

## 2023-10-06 DIAGNOSIS — M25511 Pain in right shoulder: Secondary | ICD-10-CM | POA: Diagnosis not present

## 2023-10-06 DIAGNOSIS — M545 Low back pain, unspecified: Secondary | ICD-10-CM | POA: Diagnosis not present

## 2023-10-06 DIAGNOSIS — M542 Cervicalgia: Secondary | ICD-10-CM | POA: Diagnosis not present

## 2023-10-08 DIAGNOSIS — M545 Low back pain, unspecified: Secondary | ICD-10-CM | POA: Diagnosis not present

## 2023-10-13 DIAGNOSIS — M545 Low back pain, unspecified: Secondary | ICD-10-CM | POA: Diagnosis not present

## 2023-10-15 ENCOUNTER — Encounter: Payer: Self-pay | Admitting: Family Medicine

## 2023-10-15 ENCOUNTER — Other Ambulatory Visit (HOSPITAL_COMMUNITY)
Admission: RE | Admit: 2023-10-15 | Discharge: 2023-10-15 | Disposition: A | Discharge status: Home / Self Care | Source: Ambulatory Visit | Attending: Family Medicine | Admitting: Family Medicine

## 2023-10-15 ENCOUNTER — Ambulatory Visit (INDEPENDENT_AMBULATORY_CARE_PROVIDER_SITE_OTHER): Payer: BC Managed Care – PPO | Admitting: Family Medicine

## 2023-10-15 VITALS — BP 122/76 | HR 96 | Resp 16 | Ht 65.0 in | Wt 125.1 lb

## 2023-10-15 DIAGNOSIS — Z124 Encounter for screening for malignant neoplasm of cervix: Secondary | ICD-10-CM

## 2023-10-15 DIAGNOSIS — E038 Other specified hypothyroidism: Secondary | ICD-10-CM

## 2023-10-15 DIAGNOSIS — R7301 Impaired fasting glucose: Secondary | ICD-10-CM | POA: Diagnosis not present

## 2023-10-15 DIAGNOSIS — E7849 Other hyperlipidemia: Secondary | ICD-10-CM | POA: Diagnosis not present

## 2023-10-15 DIAGNOSIS — F339 Major depressive disorder, recurrent, unspecified: Secondary | ICD-10-CM

## 2023-10-15 DIAGNOSIS — E559 Vitamin D deficiency, unspecified: Secondary | ICD-10-CM | POA: Diagnosis not present

## 2023-10-15 DIAGNOSIS — Z0001 Encounter for general adult medical examination with abnormal findings: Secondary | ICD-10-CM

## 2023-10-15 MED ORDER — BUPROPION HCL ER (SR) 150 MG PO TB12
150.0000 mg | ORAL_TABLET | Freq: Every day | ORAL | 1 refills | Status: DC
Start: 2023-10-15 — End: 2023-10-19

## 2023-10-15 NOTE — Progress Notes (Signed)
 Complete physical exam  Patient: Joy Bautista   DOB: [birth year]   58 y.o. Female  MRN: [id number]  Subjective:    Chief Complaint  Patient presents with   Annual Exam    Wants her A1c checked and wants to discuss cymbalta and wants to see if there is another option     Joy Bautista is a 58 y.o. female who presents today for a complete physical exam. She reports consuming a general diet. Home exercise routine includes walking. Gym/ health club routine includes treadmill and walking on track . She generally feels well. She reports sleeping well. She does have additional problems to discuss today.    Most recent fall risk assessment:    10/15/2023    8:12 AM  Fall Risk   Falls in the past year? 1  Number falls in past yr: 0  Injury with Fall? 0  Follow up Falls evaluation completed     Most recent depression screenings:    10/15/2023    8:12 AM 02/18/2023    8:52 AM  PHQ 2/9 Scores  PHQ - 2 Score 3 0  PHQ- 9 Score 14 0    Dental: No current dental problems and Last dental visit: 06/2023  Patient Active Problem List   Diagnosis Date Noted   Foot pain, left 02/18/2023   Hand pain, left 02/18/2023   Right knee pain 02/18/2023   Vitamin D insufficiency 08/28/2022   Patellar tendinitis of left knee 08/28/2022   Left shoulder pain 05/16/2022   Breast mass seen on mammogram 05/16/2022   Steatorrhea 04/11/2022   Chronic musculoskeletal pain 04/11/2022   Encounter for general adult medical examination with abnormal findings 04/11/2022   Epigastric pain 10/10/2021   Suprapubic pain 10/10/2021   Enuresis 09/05/2021   Chronic right shoulder pain 02/06/2021   Headache, drug induced 06/08/2020   Diabetes mellitus without complication (HCC) 04/13/2018   Tinnitus, bilateral 04/13/2018   Loud snoring 04/13/2018   Hyperlipidemia 04/13/2018   Cervical cancer screening 01/25/2014   Generalized anxiety disorder 07/10/2010   Depression, recurrent (HCC) 07/10/2010   Hearing  loss 07/10/2010   Allergic rhinitis 07/10/2010   MENOPAUSAL SYNDROME 07/10/2010   Past Medical History:  Diagnosis Date   Allergy    Anxiety    Arthritis    Breast cancer screening by mammogram 05/25/2020   Cataract    bilateral   Cervical cancer screening 01/25/2014   Chest pain    CTS (carpal tunnel syndrome)    Depression    Diabetes mellitus without complication (HCC)    Dizziness    Encounter to establish care 04/11/2022   Gout    Kidney stone 08/08/2019   Stone confirmed to be calcium oxalate.   SOB (shortness of breath)    Past Surgical History:  Procedure Laterality Date   CATARACT EXTRACTION     EYE SURGERY     Neck fusion C5-6     TONSILECTOMY, ADENOIDECTOMY, BILATERAL MYRINGOTOMY AND TUBES     Social History   Tobacco Use   Smoking status: Former    Current packs/day: 0.00    Types: Cigarettes    Quit date: 10/30/1990    Years since quitting: 32.9   Smokeless tobacco: Never  Vaping Use   Vaping status: Never Used  Substance Use Topics   Alcohol use: No   Drug use: No   Social History   Socioeconomic History   Marital status: Divorced    Spouse name: Not on file   Number  of children: Not on file   Years of education: Not on file   Highest education level: 12th grade  Occupational History   Not on file  Tobacco Use   Smoking status: Former    Current packs/day: 0.00    Types: Cigarettes    Quit date: 10/30/1990    Years since quitting: 32.9   Smokeless tobacco: Never  Vaping Use   Vaping status: Never Used  Substance and Sexual Activity   Alcohol use: No   Drug use: No   Sexual activity: Not Currently  Other Topics Concern   Not on file  Social History Narrative   Not on file   Social Drivers of Health   Financial Resource Strain: Low Risk  (10/14/2023)   Overall Financial Resource Strain (CARDIA)    Difficulty of Paying Living Expenses: Not hard at all  Food Insecurity: No Food Insecurity (10/14/2023)   Hunger Vital Sign    Worried  About Running Out of Food in the Last Year: Never true    Ran Out of Food in the Last Year: Never true  Transportation Needs: No Transportation Needs (10/14/2023)   PRAPARE - Administrator, Civil Service (Medical): No    Lack of Transportation (Non-Medical): No  Physical Activity: Sufficiently Active (10/14/2023)   Exercise Vital Sign    Days of Exercise per Week: 3 days    Minutes of Exercise per Session: 90 min  Stress: No Stress Concern Present (10/14/2023)   [employer] of Occupational Health - Occupational Stress Questionnaire    Feeling of Stress : Not at all  Social Connections: Moderately Isolated (10/14/2023)   Social Connection and Isolation Panel [NHANES]    Frequency of Communication with Friends and Family: Once a week    Frequency of Social Gatherings with Friends and Family: Once a week    Attends Religious Services: More than 4 times per year    Active Member of [employer] or Organizations: Yes    Attends Engineer, structural: More than 4 times per year    Marital Status: Divorced  Catering manager Violence: Not on file   No family status information on file.   History reviewed. No pertinent family history. Allergies  Allergen Reactions   Peanut-Containing Drug Products Diarrhea   Shellfish Allergy    Sulfonamide Derivatives       Patient Care Team: Yoshua Geisinger, FNP as PCP - [facility] (Family [facility], [facility]  (Optometry) Lucendia Rusk, DO [facility])   Outpatient Medications Prior to Visit  Medication Sig   acetaminophen (ARTHRITIS PAIN APAP) 650 MG CR tablet Take 650 mg by mouth every 8 (eight) hours as needed for pain.   Cholecalciferol (VITAMIN D) 50 MCG (2000 UT) CAPS Take 1 capsule by mouth daily.   cyclobenzaprine (FLEXERIL) 5 MG tablet Take 1 tablet (5 mg total) by mouth 3 (three) times daily as needed for muscle spasms.   DULoxetine (CYMBALTA) 60 MG capsule TAKE 1 CAPSULE(60 MG) BY MOUTH DAILY    Melatonin 10 MG TABS Take 1 tablet by mouth as needed.   meloxicam (MOBIC) 15 MG tablet Take 15 mg by mouth daily as needed for pain.   metFORMIN (GLUCOPHAGE-XR) 500 MG 24 hr tablet TAKE 2 TABLETS(1000 MG) BY MOUTH DAILY WITH BREAKFAST   polyethylene glycol powder (GLYCOLAX/MIRALAX) 17 GM/SCOOP powder Take 17 g by mouth 2 (two) times daily as needed.   triamcinolone cream (KENALOG) 0.1 % Apply 1 Application topically 2 (two) times daily.  No facility-administered medications prior to visit.    Review of Systems  Constitutional:  Negative for chills, fever and malaise/fatigue.  HENT:  Negative for congestion and sinus pain.   Eyes:  Negative for pain, discharge and redness.  Respiratory:  Negative for cough, sputum production and shortness of breath.   Cardiovascular:  Negative for chest pain, palpitations, claudication and leg swelling.  Gastrointestinal:  Negative for diarrhea, heartburn and nausea.  Genitourinary:  Negative for flank pain and frequency.  Musculoskeletal:  Negative for back pain and joint pain.  Skin:  Negative for itching.  Neurological:  Negative for dizziness, seizures and headaches.  Endo/Heme/Allergies:  Negative for environmental allergies.  Psychiatric/Behavioral:  Negative for memory loss. The patient does not have insomnia.        Objective:    BP 122/76   Pulse 96   Resp 16   Ht 5\' 5"  (1.651 m)   Wt 125 lb 1.9 oz (56.8 kg)   LMP 03/22/2016   SpO2 96%   BMI 20.82 kg/m  BP Readings from Last 3 Encounters:  10/15/23 122/76  02/25/23 (!) 145/75  02/18/23 126/72   Wt Readings from Last 3 Encounters:  10/15/23 125 lb 1.9 oz (56.8 kg)  02/25/23 120 lb (54.4 kg)  02/18/23 120 lb 0.6 oz (54.4 kg)      Physical Exam HENT:     Head: Normocephalic.     Left Ear: External ear normal.     Mouth/Throat:     Mouth: Mucous membranes are moist.  Eyes:     Extraocular Movements: Extraocular movements intact.     Pupils: Pupils are equal, round, and  reactive to light.  Cardiovascular:     Rate and Rhythm: Normal rate and regular rhythm.     Heart sounds: Normal heart sounds. No murmur heard. Pulmonary:     Effort: Pulmonary effort is normal.     Breath sounds: Normal breath sounds.  Abdominal:     Palpations: Abdomen is soft.     Tenderness: There is no right CVA tenderness or left CVA tenderness.  Genitourinary:    Exam position: Lithotomy position.     Pubic Area: No rash.      Tanner stage (genital): 5.     Comments: Vaginal wall: pink and rugated, smooth and non-tender; absence of lesions, edema, and erythema. Labia Majora and Minora: present bilaterally, moist, soft tissue, and homogeneous; free of edema and ulcerations. Clitoris is anatomically present, above the urethral, and free of lesions, masses, and ulceration.    Musculoskeletal:     Right lower leg: No edema.     Left lower leg: No edema.  Skin:    Findings: No lesion or rash.  Neurological:     Mental Status: She is alert and oriented to person, place, and time.     GCS: GCS eye subscore is 4. GCS verbal subscore is 5. GCS motor subscore is 6.     Cranial Nerves: No facial asymmetry.     Sensory: No sensory deficit.     Motor: No weakness.     Coordination: Coordination normal. Finger-Nose-Finger Test normal.     Gait: Gait normal.  Psychiatric:        Judgment: Judgment normal.     Results for orders placed or performed in visit on 10/15/23  Hemoglobin A1c  Result Value Ref Range   Hgb A1c MFr Bld 7.8 (H) 4.8 - 5.6 %   Est. average glucose Bld gHb Est-mCnc 177 mg/dL  VITAMIN D 25 Hydroxy (Vit-D Deficiency, Fractures)  Result Value Ref Range   Vit D, 25-Hydroxy 31.7 30.0 - 100.0 ng/mL  TSH + free T4  Result Value Ref Range   TSH 1.270 0.450 - 4.500 uIU/mL   Free T4 1.29 0.82 - 1.77 ng/dL  Lipid panel  Result Value Ref Range   Cholesterol, Total 229 (H) 100 - 199 mg/dL   Triglycerides 161 0 - 149 mg/dL   HDL 65 >09 mg/dL   VLDL Cholesterol Cal 19  5 - 40 mg/dL   LDL Chol Calc (NIH) 604 (H) 0 - 99 mg/dL   Chol/HDL Ratio 3.5 0.0 - 4.4 ratio  CMP14+EGFR  Result Value Ref Range   Glucose 214 (H) 70 - 99 mg/dL   BUN 12 8 - 27 mg/dL   Creatinine, Ser 5.40 0.57 - 1.00 mg/dL   eGFR 78 >98 JX/BJY/7.82   BUN/Creatinine Ratio 14 12 - 28   Sodium 146 (H) 134 - 144 mmol/L   Potassium 4.7 3.5 - 5.2 mmol/L   Chloride 108 (H) 96 - 106 mmol/L   CO2 23 20 - 29 mmol/L   Calcium 10.2 8.7 - 10.3 mg/dL   Total Protein 7.1 6.0 - 8.5 g/dL   Albumin 4.6 3.8 - 4.9 g/dL   Globulin, Total 2.5 1.5 - 4.5 g/dL   Bilirubin Total 0.4 0.0 - 1.2 mg/dL   Alkaline Phosphatase 86 44 - 121 IU/L   AST 15 0 - 40 IU/L   ALT 21 0 - 32 IU/L  CBC with Differential/Platelet  Result Value Ref Range   WBC 6.4 3.4 - 10.8 x10E3/uL   RBC 4.52 3.77 - 5.28 x10E6/uL   Hemoglobin 13.7 11.1 - 15.9 g/dL   Hematocrit 95.6 21.3 - 46.6 %   MCV 93 79 - 97 fL   MCH 30.3 26.6 - 33.0 pg   MCHC 32.6 31.5 - 35.7 g/dL   RDW 08.6 57.8 - 46.9 %   Platelets 329 150 - 450 x10E3/uL   Neutrophils 54 Not Estab. %   Lymphs 36 Not Estab. %   Monocytes 8 Not Estab. %   Eos 1 Not Estab. %   Basos 1 Not Estab. %   Neutrophils Absolute 3.4 1.4 - 7.0 x10E3/uL   Lymphocytes Absolute 2.3 0.7 - 3.1 x10E3/uL   Monocytes Absolute 0.5 0.1 - 0.9 x10E3/uL   EOS (ABSOLUTE) 0.1 0.0 - 0.4 x10E3/uL   Basophils Absolute 0.1 0.0 - 0.2 x10E3/uL   Immature Granulocytes 0 Not Estab. %   Immature Grans (Abs) 0.0 0.0 - 0.1 x10E3/uL   Last CBC Lab Results  Component Value Date   WBC 6.4 10/15/2023   HGB 13.7 10/15/2023   HCT 42.0 10/15/2023   MCV 93 10/15/2023   MCH 30.3 10/15/2023   RDW 12.5 10/15/2023   PLT 329 10/15/2023   Last metabolic panel Lab Results  Component Value Date   GLUCOSE 214 (H) 10/15/2023   NA 146 (H) 10/15/2023   K 4.7 10/15/2023   CL 108 (H) 10/15/2023   CO2 23 10/15/2023   BUN 12 10/15/2023   CREATININE 0.85 10/15/2023   EGFR 78 10/15/2023   CALCIUM 10.2 10/15/2023    PROT 7.1 10/15/2023   ALBUMIN 4.6 10/15/2023   LABGLOB 2.5 10/15/2023   AGRATIO 2.0 04/11/2022   BILITOT 0.4 10/15/2023   ALKPHOS 86 10/15/2023   AST 15 10/15/2023   ALT 21 10/15/2023   ANIONGAP 14 02/16/2023   Last lipids Lab Results  Component Value  Date   CHOL 229 (H) 10/15/2023   HDL 65 10/15/2023   LDLCALC 145 (H) 10/15/2023   TRIG 109 10/15/2023   CHOLHDL 3.5 10/15/2023   Last hemoglobin A1c Lab Results  Component Value Date   HGBA1C 7.8 (H) 10/15/2023   Last thyroid functions Lab Results  Component Value Date   TSH 1.270 10/15/2023   Last vitamin D Lab Results  Component Value Date   VD25OH 31.7 10/15/2023   Last vitamin B12 and Folate No results found for: "VITAMINB12", "FOLATE"      Assessment & Plan:    Routine Health Maintenance and Physical Exam  Immunization History  Administered Date(s) Administered   Influenza,inj,Quad PF,6+ Mos 05/11/2014, 04/06/2016, 04/09/2018, 05/18/2019   Influenza-Unspecified 05/08/2015, 04/02/2020   PFIZER(Purple Top)SARS-COV-2 Vaccination 05/08/2020   Pneumococcal Polysaccharide-23 01/25/2014   Tdap 01/10/2014    Health Maintenance  Topic Date Due   Zoster Vaccines- Shingrix (1 of 2) Never done   OPHTHALMOLOGY EXAM  07/18/2020   Cervical Cancer Screening (HPV/Pap Cotest)  05/17/2022   COVID-19 Vaccine (2 - 2024-25 season) 03/08/2023   FOOT EXAM  04/12/2023   Pneumococcal Vaccine 64-29 Years old (2 of 2 - PCV) 10/14/2024 (Originally 01/26/2015)   Diabetic kidney evaluation - Urine ACR  11/28/2023   DTaP/Tdap/Td (2 - Td or Tdap) 01/11/2024   INFLUENZA VACCINE  02/05/2024   MAMMOGRAM  04/14/2024   Colonoscopy  04/14/2024   HEMOGLOBIN A1C  04/15/2024   Diabetic kidney evaluation - eGFR measurement  10/14/2024   Hepatitis C Screening  Completed   HIV Screening  Completed   HPV VACCINES  Aged Out   Meningococcal B Vaccine  Aged Out    Discussed health benefits of physical activity, and encouraged her to engage in  regular exercise appropriate for her age and condition.  IFG (impaired fasting glucose) -     Hemoglobin A1c  Encounter for general adult medical examination with abnormal findings Assessment & Plan: Physical exam as documented Discussed heart-healthy diet  Encouraged to Exercise: If you are able: 30 -60 minutes a day ,4 days a week, or 150 minutes a week. The longer the better. Combine stretch, strength, and aerobic activities Encourage to eat whole Food, Plant Predominant Nutrition is highly recommended: Eat Plenty of vegetables, Mushrooms, fruits, Legumes, Whole Grains, Nuts, seeds in lieu of processed meats, processed snacks/pastries red meat, poultry, eggs.  Will f/u in 1 year for CPE      Depression, recurrent [facility]) Assessment & Plan: The patient reports that she has plateaued on Cymbalta 60 mg daily, with minimal relief of her symptoms. She was encouraged to taper off Cymbalta and start taking Wellbutrin XL 150 mg daily. She was advised that if symptoms do not improve, the dose may be increased to 300 mg daily (two 150 mg tablets) after 6 weeks, based on tolerance and response. She was encouraged to monitor for potential side effects, including: insomnia, headache, dry mouth, increased anxiety or agitation, nausea, and dizziness. She was also encouraged to notify her provider if she experiences worsening mood, suicidal thoughts, or any concerning side effects.   Orders: -     buPROPion HCl ER (SR); Take 1 tablet (150 mg total) by mouth daily.  Dispense: 60 tablet; Refill: 1  Cervical cancer screening Assessment & Plan: Pap smear competed Cytology and HPV co-testing (preferred) every 5 years or cytology alone (acceptable) every 3 years. - Pap due 2030   Orders: -     Cytology - PAP  Vitamin D  deficiency -     VITAMIN D 25 Hydroxy (Vit-D Deficiency, Fractures)  TSH (thyroid-stimulating hormone deficiency) -     TSH + free T4  Other hyperlipidemia -     Lipid panel -      CMP14+EGFR -     CBC with Differential/Platelet    No follow-ups on file.   Note: This chart has been completed using Engineer, civil (consulting) software, and while attempts have been made to ensure accuracy, certain words and phrases may not be transcribed as intended.    Cyera Balboni, FNP

## 2023-10-15 NOTE — Patient Instructions (Addendum)
 I appreciate the opportunity to provide care to you today!    Follow up:  4 months  Labs: please stop by the lab today to get your blood drawn (CBC, CMP, TSH, Lipid profile, HgA1c, Vit D)   Schedule diabetic eye exam   You will be informed of your Pap smear results via MyChart once they are available.  Depression Start Wellbutrin XL (bupropion extended-release) 150 mg by mouth once daily. If symptoms do not improve, the dose may be increased to 300 mg daily (two 150 mg tablets) after 6 weeks, based on tolerance and response.  Please monitor for potential side effects, including: Insomnia Headache Dry mouth Increased anxiety or agitation Nausea Dizziness Notify your provider if you experience worsening mood, suicidal thoughts, or any concerning side effects   Attached with your AVS, you will find valuable resources for self-education. I highly recommend dedicating some time to thoroughly examine them.   Please continue to a heart-healthy diet and increase your physical activities. Try to exercise for at least five days a week.    It was a pleasure to see you and I look forward to continuing to work together on your health and well-being. Please do not hesitate to call the office if you need care or have questions about your care.  In case of emergency, please visit the Emergency Department for urgent care, or contact our clinic at [phone] to schedule an appointment. We're here to help you!   Have a wonderful day and week. With Gratitude, Gilmore Laroche MSN, FNP-BC

## 2023-10-16 ENCOUNTER — Encounter: Payer: Self-pay | Admitting: Family Medicine

## 2023-10-16 LAB — CMP14+EGFR
ALT: 21 IU/L (ref 0–32)
AST: 15 IU/L (ref 0–40)
Albumin: 4.6 g/dL (ref 3.8–4.9)
Alkaline Phosphatase: 86 IU/L (ref 44–121)
BUN/Creatinine Ratio: 14 (ref 12–28)
BUN: 12 mg/dL (ref 8–27)
Bilirubin Total: 0.4 mg/dL (ref 0.0–1.2)
CO2: 23 mmol/L (ref 20–29)
Calcium: 10.2 mg/dL (ref 8.7–10.3)
Chloride: 108 mmol/L — ABNORMAL HIGH (ref 96–106)
Creatinine, Ser: 0.85 mg/dL (ref 0.57–1.00)
Globulin, Total: 2.5 g/dL (ref 1.5–4.5)
Glucose: 214 mg/dL — ABNORMAL HIGH (ref 70–99)
Potassium: 4.7 mmol/L (ref 3.5–5.2)
Sodium: 146 mmol/L — ABNORMAL HIGH (ref 134–144)
Total Protein: 7.1 g/dL (ref 6.0–8.5)
eGFR: 78 mL/min/{1.73_m2} (ref >59)

## 2023-10-16 LAB — TSH+FREE T4
Free T4: 1.29 ng/dL (ref 0.82–1.77)
TSH: 1.27 u[IU]/mL (ref 0.450–4.500)

## 2023-10-16 LAB — CBC WITH DIFFERENTIAL/PLATELET
Basophils Absolute: 0.1 10*3/uL (ref 0.0–0.2)
Basos: 1 %
EOS (ABSOLUTE): 0.1 10*3/uL (ref 0.0–0.4)
Eos: 1 %
Hematocrit: 42 % (ref 34.0–46.6)
Hemoglobin: 13.7 g/dL (ref 11.1–15.9)
Immature Grans (Abs): 0 10*3/uL (ref 0.0–0.1)
Immature Granulocytes: 0 %
Lymphocytes Absolute: 2.3 10*3/uL (ref 0.7–3.1)
Lymphs: 36 %
MCH: 30.3 pg (ref 26.6–33.0)
MCHC: 32.6 g/dL (ref 31.5–35.7)
MCV: 93 fL (ref 79–97)
Monocytes Absolute: 0.5 10*3/uL (ref 0.1–0.9)
Monocytes: 8 %
Neutrophils Absolute: 3.4 10*3/uL (ref 1.4–7.0)
Neutrophils: 54 %
Platelets: 329 10*3/uL (ref 150–450)
RBC: 4.52 x10E6/uL (ref 3.77–5.28)
RDW: 12.5 % (ref 11.7–15.4)
WBC: 6.4 10*3/uL (ref 3.4–10.8)

## 2023-10-16 LAB — LIPID PANEL
Chol/HDL Ratio: 3.5 ratio (ref 0.0–4.4)
Cholesterol, Total: 229 mg/dL — ABNORMAL HIGH (ref 100–199)
HDL: 65 mg/dL (ref >39)
LDL Chol Calc (NIH): 145 mg/dL — ABNORMAL HIGH (ref 0–99)
Triglycerides: 109 mg/dL (ref 0–149)
VLDL Cholesterol Cal: 19 mg/dL (ref 5–40)

## 2023-10-16 LAB — HEMOGLOBIN A1C
Est. average glucose Bld gHb Est-mCnc: 177 mg/dL
Hgb A1c MFr Bld: 7.8 % — ABNORMAL HIGH (ref 4.8–5.6)

## 2023-10-16 LAB — VITAMIN D 25 HYDROXY (VIT D DEFICIENCY, FRACTURES): Vit D, 25-Hydroxy: 31.7 ng/mL (ref 30.0–100.0)

## 2023-10-17 NOTE — Assessment & Plan Note (Signed)
 The patient reports that she has plateaued on Cymbalta 60 mg daily, with minimal relief of her symptoms. She was encouraged to taper off Cymbalta and start taking Wellbutrin XL 150 mg daily. She was advised that if symptoms do not improve, the dose may be increased to 300 mg daily (two 150 mg tablets) after 6 weeks, based on tolerance and response. She was encouraged to monitor for potential side effects, including: insomnia, headache, dry mouth, increased anxiety or agitation, nausea, and dizziness. She was also encouraged to notify her provider if she experiences worsening mood, suicidal thoughts, or any concerning side effects.

## 2023-10-17 NOTE — Assessment & Plan Note (Signed)
 Pap smear competed Cytology and HPV co-testing (preferred) every 5 years or cytology alone (acceptable) every 3 years. - Pap due 2030

## 2023-10-17 NOTE — Assessment & Plan Note (Signed)

## 2023-10-19 ENCOUNTER — Other Ambulatory Visit: Payer: Self-pay

## 2023-10-19 ENCOUNTER — Ambulatory Visit: Payer: Self-pay | Admitting: Family Medicine

## 2023-10-19 DIAGNOSIS — F339 Major depressive disorder, recurrent, unspecified: Secondary | ICD-10-CM

## 2023-10-19 MED ORDER — BUPROPION HCL ER (SR) 150 MG PO TB12: 150.0000 mg | ORAL_TABLET | Freq: Every day | ORAL | 1 refills | Status: AC

## 2023-10-19 NOTE — Telephone Encounter (Signed)
 Medication resent to [facility] pharmacy in [place] per pt. Request.

## 2023-10-19 NOTE — Telephone Encounter (Signed)
 Copied from CRM [id number]. Topic: Clinical - Prescription Issue >> Oct 19, 2023 11:51 AM Jyl Or S wrote: Reason for CRM: buPROPion (WELLBUTRIN SR) 150 MG 12 hr tablet [[id number]] Patient wants prescription sent to new pharmacy   [facility]  [address]  [place] [address]  [phone]

## 2023-10-20 ENCOUNTER — Telehealth: Payer: Self-pay | Admitting: Pharmacy Technician

## 2023-10-20 ENCOUNTER — Other Ambulatory Visit (HOSPITAL_COMMUNITY): Payer: Self-pay

## 2023-10-20 ENCOUNTER — Other Ambulatory Visit: Payer: Self-pay | Admitting: Family Medicine

## 2023-10-20 DIAGNOSIS — E785 Hyperlipidemia, unspecified: Secondary | ICD-10-CM

## 2023-10-20 LAB — CYTOLOGY - PAP
Comment: NEGATIVE
Diagnosis: NEGATIVE
High risk HPV: NEGATIVE

## 2023-10-20 NOTE — Telephone Encounter (Signed)
 Pharmacy Patient Advocate Encounter   Received notification from CoverMyMeds that prior authorization for buPROPion HCl ER (SR) 150MG  er tablets is required/requested.   Insurance verification completed.   The patient is insured through [employer] [facility] .   Per test claim: Refill too soon. PA is not needed at this time. Medication was filled 10/19/2023. Next eligible fill date is 11/11/2023.

## 2023-10-29 DIAGNOSIS — M539 Dorsopathy, unspecified: Secondary | ICD-10-CM | POA: Diagnosis not present

## 2023-10-31 NOTE — Progress Notes (Signed)
 Hello Joy Bautista Your hemoglobin A1c has decreased - great effort! However, your cholesterol levels have increased. I recommend decreasing your intake of greasy, fatty, and starchy foods, along with increasing your physical activity.

## 2023-11-01 ENCOUNTER — Ambulatory Visit
Admission: EM | Admit: 2023-11-01 | Discharge: 2023-11-01 | Disposition: A | Discharge status: Home / Self Care | Attending: Nurse Practitioner | Admitting: Nurse Practitioner

## 2023-11-01 ENCOUNTER — Encounter: Payer: Self-pay | Admitting: Emergency Medicine

## 2023-11-01 DIAGNOSIS — Z8709 Personal history of other diseases of the respiratory system: Secondary | ICD-10-CM | POA: Diagnosis not present

## 2023-11-01 DIAGNOSIS — J029 Acute pharyngitis, unspecified: Secondary | ICD-10-CM | POA: Insufficient documentation

## 2023-11-01 DIAGNOSIS — J069 Acute upper respiratory infection, unspecified: Secondary | ICD-10-CM | POA: Insufficient documentation

## 2023-11-01 LAB — POC COVID19/FLU A&B COMBO
Covid Antigen, POC: NEGATIVE
Influenza A Antigen, POC: NEGATIVE
Influenza B Antigen, POC: NEGATIVE

## 2023-11-01 LAB — POCT RAPID STREP A (OFFICE): Rapid Strep A Screen: NEGATIVE

## 2023-11-01 MED ORDER — CETIRIZINE HCL 10 MG PO TABS: 10.0000 mg | ORAL_TABLET | Freq: Every day | ORAL | 0 refills | Status: AC

## 2023-11-01 MED ORDER — PSEUDOEPH-BROMPHEN-DM 30-2-10 MG/5ML PO SYRP: 5.0000 mL | ORAL_SOLUTION | Freq: Four times a day (QID) | ORAL | 0 refills | Status: AC | PRN

## 2023-11-01 MED ORDER — FLUTICASONE PROPIONATE 50 MCG/ACT NA SUSP: 2.0000 | Freq: Every day | NASAL | 0 refills | Status: AC

## 2023-11-01 NOTE — ED Provider Notes (Signed)
 [facility]    CSN: [id number] Arrival date & time: 11/01/23  4098      History   Chief Complaint No chief complaint on file.   HPI Joy Bautista is a 58 y.o. female.   The history is provided by the patient.   Patient presents with a 3-day history of nasal congestion, sore throat and cough.  Patient also endorses low-grade fever, states that her temperature normally runs low and when it is higher than 98, she has a fever.  Patient denies headache, ear pain, wheezing, difficulty breathing, chest pain, abdominal pain, nausea, vomiting, diarrhea, or rash.  Patient reports she has been taking Benadryl and Sudafed with minimal relief.  Past Medical History:  Diagnosis Date   Allergy    Anxiety    Arthritis    Breast cancer screening by mammogram 05/25/2020   Cataract    bilateral   Cervical cancer screening 01/25/2014   Chest pain    CTS (carpal tunnel syndrome)    Depression    Diabetes mellitus without complication (HCC)    Dizziness    Encounter to establish care 04/11/2022   Gout    Kidney stone 08/08/2019   Stone confirmed to be calcium oxalate.   SOB (shortness of breath)     Patient Active Problem List   Diagnosis Date Noted   Foot pain, left 02/18/2023   Hand pain, left 02/18/2023   Right knee pain 02/18/2023   Vitamin D  insufficiency 08/28/2022   Patellar tendinitis of left knee 08/28/2022   Left shoulder pain 05/16/2022   Breast mass seen on mammogram 05/16/2022   Steatorrhea 04/11/2022   Chronic musculoskeletal pain 04/11/2022   Encounter for general adult medical examination with abnormal findings 04/11/2022   Epigastric pain 10/10/2021   Suprapubic pain 10/10/2021   Enuresis 09/05/2021   Chronic right shoulder pain 02/06/2021   Headache, drug induced 06/08/2020   Diabetes mellitus without complication (HCC) 04/13/2018   Tinnitus, bilateral 04/13/2018   Loud snoring 04/13/2018   Hyperlipidemia 04/13/2018   Cervical cancer screening  01/25/2014   Generalized anxiety disorder 07/10/2010   Depression, recurrent (HCC) 07/10/2010   Hearing loss 07/10/2010   Allergic rhinitis 07/10/2010   MENOPAUSAL SYNDROME 07/10/2010    Past Surgical History:  Procedure Laterality Date   CATARACT EXTRACTION     EYE SURGERY     Neck fusion C5-6     TONSILECTOMY, ADENOIDECTOMY, BILATERAL MYRINGOTOMY AND TUBES      OB History   No obstetric history on file.      Home Medications    Prior to Admission medications   Medication Sig Start Date End Date Taking? Authorizing Provider  metFORMIN  (GLUCOPHAGE ) 500 MG tablet Take 500 mg by mouth 2 (two) times daily with a meal.   Yes [provider]  acetaminophen  (ARTHRITIS PAIN APAP) 650 MG CR tablet Take 650 mg by mouth every 8 (eight) hours as needed for pain.    [provider]  buPROPion  (WELLBUTRIN  SR) 150 MG 12 hr tablet Take 1 tablet (150 mg total) by mouth daily. 10/19/23   Zarwolo, Gloria, FNP  Cholecalciferol (VITAMIN D ) 50 MCG (2000 UT) CAPS Take 1 capsule by mouth daily.    [provider]  cyclobenzaprine  (FLEXERIL ) 5 MG tablet Take 1 tablet (5 mg total) by mouth 3 (three) times daily as needed for muscle spasms. 11/03/22   Tobi Fortes, MD  Melatonin 10 MG TABS Take 1 tablet by mouth as needed.    [provider]  polyethylene glycol powder (GLYCOLAX /MIRALAX ) 17 GM/SCOOP powder Take 17 g by mouth 2 (two) times daily as needed. 10/09/21   Santana Cue, MD  triamcinolone  cream (KENALOG ) 0.1 % Apply 1 Application topically 2 (two) times daily. 02/02/23   Leath-Warren, Belen Bowers, NP    Family History History reviewed. No pertinent family history.  Social History Social History   Tobacco Use   Smoking status: Former    Current packs/day: 0.00    Types: Cigarettes    Quit date: 10/30/1990    Years since quitting: 33.0   Smokeless tobacco: Never  Vaping Use   Vaping status: Never Used  Substance Use Topics   Alcohol use: No    Drug use: No     Allergies   Peanut-containing drug products, Shellfish allergy, and Sulfonamide derivatives   Review of Systems Review of Systems Per HPI  Physical Exam Triage Vital Signs ED Triage Vitals  Encounter Vitals Group     BP 11/01/23 0931 137/78     Systolic BP Percentile --      Diastolic BP Percentile --      Pulse Rate 11/01/23 0931 (!) 101     Resp 11/01/23 0931 18     Temp 11/01/23 0931 98.5 F (36.9 C)     Temp Source 11/01/23 0931 Oral     SpO2 11/01/23 0931 96 %     Weight --      Height --      Head Circumference --      Peak Flow --      Pain Score 11/01/23 0933 2     Pain Loc --      Pain Education --      Exclude from Growth Chart --    No data found.  Updated Vital Signs BP 137/78 (BP Location: Right Arm)   Pulse (!) 101   Temp 98.5 F (36.9 C) (Oral)   Resp 18   LMP 03/22/2016   SpO2 96%   Visual Acuity Right Eye Distance:   Left Eye Distance:   Bilateral Distance:    Right Eye Near:   Left Eye Near:    Bilateral Near:     Physical Exam Vitals and nursing note reviewed.  Constitutional:      General: She is not in acute distress.    Appearance: Normal appearance.  HENT:     Head: Normocephalic.     Right Ear: Tympanic membrane, ear canal and external ear normal.     Left Ear: Tympanic membrane, ear canal and external ear normal.     Nose: Congestion present.     Right Turbinates: Enlarged and swollen.     Left Turbinates: Enlarged and swollen.     Right Sinus: No maxillary sinus tenderness or frontal sinus tenderness.     Left Sinus: No maxillary sinus tenderness or frontal sinus tenderness.     Mouth/Throat:     Lips: Pink.     Mouth: Mucous membranes are moist.     Pharynx: Posterior oropharyngeal erythema and postnasal drip present. No pharyngeal swelling, oropharyngeal exudate or uvula swelling.     Comments: Cobblestoning present to posterior oropharynx  Eyes:     Extraocular Movements: Extraocular movements  intact.     Conjunctiva/sclera: Conjunctivae normal.     Pupils: Pupils are equal, round, and reactive to light.  Cardiovascular:     Rate and Rhythm: Normal rate and regular rhythm.     Pulses: Normal pulses.  Heart sounds: Normal heart sounds.  Pulmonary:     Effort: Pulmonary effort is normal.     Breath sounds: Normal breath sounds.  Abdominal:     General: Bowel sounds are normal.     Palpations: Abdomen is soft.     Tenderness: There is no abdominal tenderness.  Musculoskeletal:     Cervical back: Normal range of motion.  Lymphadenopathy:     Cervical: No cervical adenopathy.  Skin:    General: Skin is warm and dry.  Neurological:     General: No focal deficit present.     Mental Status: She is alert and oriented to person, place, and time.  Psychiatric:        Mood and Affect: Mood normal.        Behavior: Behavior normal.      UC Treatments / Results  Labs (all labs ordered are listed, but only abnormal results are displayed) Labs Reviewed  POC COVID19/FLU A&B COMBO  POCT RAPID STREP A (OFFICE)    EKG   Radiology No results found.  Procedures Procedures (including critical care time)  Medications Ordered in UC Medications - No data to display  Initial Impression / Assessment and Plan / UC Course  I have reviewed the triage vital signs and the nursing notes.  Pertinent labs & imaging results that were available during my care of the patient were reviewed by me and considered in my medical decision making (see chart for details).  The rapid strep test and COVID/flu test were negative.  Throat culture is pending.  On exam, patient's lung sounds are clear throughout, room air sats at 96%.  She is well-appearing, vital signs are stable.  Symptoms consistent with viral URI with underlying allergic rhinitis.  Will provide symptomatic treatment with Bromfed-DM for the cough and postnasal drainage, cetirizine 10 mg as an antihistamine, and fluticasone 50 mcg  nasal spray for postnasal drainage and congestion.  Supportive care recommendations were provided and discussed with the patient to include over-the-counter analgesics, fluids, rest, continuing warm salt water gargles, and use of Chloraseptic throat spray or throat lozenges.  Discussed indications with patient regarding follow-up.  Patient was in agreement with this plan of care and verbalizes understanding.  All questions were answered.  Patient stable for discharge.  Work note was provided.   Final Clinical Impressions(s) / UC Diagnoses   Final diagnoses:  None   Discharge Instructions   None    ED Prescriptions   None    PDMP not reviewed this encounter.   Hardy Lia, NP 11/01/23 1009

## 2023-11-01 NOTE — Discharge Instructions (Addendum)
 The rapid strep test and COVID/flu test were negative.  A throat culture has been ordered.  You will be contacted if the pending test result is abnormal.  You will also have access to results via MyChart. Take medication as prescribed. Increase fluids and allow for plenty of rest. May take over-the-counter Tylenol  or ibuprofen as needed for pain, fever, or general discomfort. Warm salt water gargles 3-4 times daily as needed for throat pain or discomfort.  You may also use over-the-counter Chloraseptic throat spray and throat lozenges while symptoms persist. Recommend normal saline nasal spray throughout the day for nasal congestion and runny nose. For the cough, it may be helpful to use a humidifier in your bedroom at nighttime during sleep and to sleep elevated on pillows while symptoms persist. As discussed, if symptoms have not improved over the next 7 to 10 days, or suddenly worsen, you may follow-up in this clinic or with your primary care physician for further evaluation. Follow-up as needed.

## 2023-11-01 NOTE — ED Triage Notes (Signed)
 Head congestion since Thursday.  Has been taking benadryl and sudafed without relief.  States throat is sore with a dry cough.  States has had a low grade fever at times.

## 2023-11-02 ENCOUNTER — Telehealth: Payer: Self-pay

## 2023-11-02 NOTE — Telephone Encounter (Signed)
 Copied from CRM [id number]. Topic: Clinical - Lab/Test Results >> Nov 02, 2023 10:01 AM Lizabeth Riggs wrote: Reason for CRM:  I read her FNP Zarwolo note dated 10/31/23. Yola had no questions and understood results.

## 2023-11-04 LAB — CULTURE, GROUP A STREP (THRC)

## 2023-11-06 ENCOUNTER — Encounter: Payer: Self-pay | Admitting: Family Medicine

## 2023-11-13 DIAGNOSIS — M542 Cervicalgia: Secondary | ICD-10-CM | POA: Diagnosis not present

## 2023-11-18 ENCOUNTER — Ambulatory Visit

## 2023-11-18 DIAGNOSIS — E119 Type 2 diabetes mellitus without complications: Secondary | ICD-10-CM

## 2023-11-18 NOTE — Progress Notes (Signed)
 Joy Bautista arrived 11/18/2023 and has given verbal consent to obtain images and complete their overdue diabetic retinal screening.  The images have been sent to an ophthalmologist or optometrist for review and interpretation.  Results will be sent back to Zarwolo, Gloria, FNP for review.  Patient has been informed they will be contacted when we receive the results via telephone or MyChart

## 2023-11-19 DIAGNOSIS — M542 Cervicalgia: Secondary | ICD-10-CM | POA: Diagnosis not present

## 2023-12-11 ENCOUNTER — Ambulatory Visit: Payer: Self-pay

## 2023-12-12 ENCOUNTER — Ambulatory Visit
Admission: EM | Admit: 2023-12-12 | Discharge: 2023-12-12 | Disposition: A | Discharge status: Home / Self Care | Attending: Nurse Practitioner | Admitting: Nurse Practitioner

## 2023-12-12 DIAGNOSIS — R197 Diarrhea, unspecified: Secondary | ICD-10-CM

## 2023-12-12 LAB — POCT URINALYSIS DIP (MANUAL ENTRY)
Bilirubin, UA: NEGATIVE
Blood, UA: NEGATIVE
Glucose, UA: NEGATIVE mg/dL
Ketones, POC UA: NEGATIVE mg/dL
Leukocytes, UA: NEGATIVE
Nitrite, UA: NEGATIVE
Spec Grav, UA: 1.025
Urobilinogen, UA: 0.2 U/dL
pH, UA: 6

## 2023-12-12 MED ORDER — LOPERAMIDE HCL 2 MG PO CAPS
2.0000 mg | ORAL_CAPSULE | Freq: Four times a day (QID) | ORAL | 0 refills | Status: AC | PRN
Start: 2023-12-12 — End: ?

## 2023-12-12 NOTE — ED Triage Notes (Signed)
 Pt reports she has been having diarrhea ands headache  x 4 days   States she went to a dinner party and felt bad afterwards

## 2023-12-12 NOTE — Discharge Instructions (Signed)
 Your urinalysis looks good today.  Blood work is pending.  You will be contacted if the pending test results are abnormal.  You will also access to your results via MyChart. Take medication as prescribed. Continue to drink plenty of fluids.  Recommend Pedialyte or Gatorade if there is concern for dehydration. Recommend dietary choices to help relieve your diarrhea symptoms.  I have provided information that you can refer to. Follow-up in the emergency department if you experience worsening diarrhea, new symptoms of fever, chills, abdominal pain, nausea, vomiting, or other concerns. Recommend follow-up with your primary care physician if symptoms fail to improve. Follow-up as needed.

## 2023-12-12 NOTE — ED Provider Notes (Signed)
 [facility]    CSN: [id number] Arrival date & time: 12/12/23  1003      History   Chief Complaint No chief complaint on file.   HPI Joy Bautista is a 58 y.o. female.   The history is provided by the patient.   Patient presents with a 3 to 4-day history of diarrhea and headache.  Patient reports she has been experiencing at least 3-4 episodes of diarrhea daily.  States prior to symptoms starting, she ate at a dinner party and shortly after that, she developed symptoms.  She denies fever, chills, chest pain, abdominal pain, nausea, vomiting, rash, visual changes, lightheadedness, or dizziness..  She further denies gas, bloating, or bloody stools.  Patient states that she has not been taking any medication for her symptoms.  Past Medical History:  Diagnosis Date   Allergy    Anxiety    Arthritis    Breast cancer screening by mammogram 05/25/2020   Cataract    bilateral   Cervical cancer screening 01/25/2014   Chest pain    CTS (carpal tunnel syndrome)    Depression    Diabetes mellitus without complication (HCC)    Dizziness    Encounter to establish care 04/11/2022   Gout    Kidney stone 08/08/2019   Stone confirmed to be calcium oxalate.   SOB (shortness of breath)     Patient Active Problem List   Diagnosis Date Noted   Foot pain, left 02/18/2023   Hand pain, left 02/18/2023   Right knee pain 02/18/2023   Vitamin D  insufficiency 08/28/2022   Patellar tendinitis of left knee 08/28/2022   Left shoulder pain 05/16/2022   Breast mass seen on mammogram 05/16/2022   Steatorrhea 04/11/2022   Chronic musculoskeletal pain 04/11/2022   Encounter for general adult medical examination with abnormal findings 04/11/2022   Epigastric pain 10/10/2021   Suprapubic pain 10/10/2021   Enuresis 09/05/2021   Chronic right shoulder pain 02/06/2021   Headache, drug induced 06/08/2020   Diabetes mellitus without complication (HCC) 04/13/2018   Tinnitus, bilateral  04/13/2018   Loud snoring 04/13/2018   Hyperlipidemia 04/13/2018   Cervical cancer screening 01/25/2014   Generalized anxiety disorder 07/10/2010   Depression, recurrent (HCC) 07/10/2010   Hearing loss 07/10/2010   Allergic rhinitis 07/10/2010   MENOPAUSAL SYNDROME 07/10/2010    Past Surgical History:  Procedure Laterality Date   CATARACT EXTRACTION     EYE SURGERY     Neck fusion C5-6     TONSILECTOMY, ADENOIDECTOMY, BILATERAL MYRINGOTOMY AND TUBES      OB History   No obstetric history on file.      Home Medications    Prior to Admission medications   Medication Sig Start Date End Date Taking? Authorizing Provider  loperamide (IMODIUM) 2 MG capsule Take 1 capsule (2 mg total) by mouth 4 (four) times daily as needed for diarrhea or loose stools. 12/12/23  Yes Leath-Warren, Belen Bowers, NP  acetaminophen  (ARTHRITIS PAIN APAP) 650 MG CR tablet Take 650 mg by mouth every 8 (eight) hours as needed for pain.    [provider]  brompheniramine-pseudoephedrine-DM 30-2-10 MG/5ML syrup Take 5 mLs by mouth 4 (four) times daily as needed. 11/01/23   Leath-Warren, Belen Bowers, NP  buPROPion  (WELLBUTRIN  SR) 150 MG 12 hr tablet Take 1 tablet (150 mg total) by mouth daily. 10/19/23   Zarwolo, Gloria, FNP  cetirizine  (ZYRTEC ) 10 MG tablet Take 1 tablet (10 mg total) by mouth daily. 11/01/23   Leath-Warren,  Belen Bowers, NP  Cholecalciferol (VITAMIN D ) 50 MCG (2000 UT) CAPS Take 1 capsule by mouth daily.    [provider]  cyclobenzaprine  (FLEXERIL ) 5 MG tablet Take 1 tablet (5 mg total) by mouth 3 (three) times daily as needed for muscle spasms. 11/03/22   Tobi Fortes, MD  fluticasone  (FLONASE ) 50 MCG/ACT nasal spray Place 2 sprays into both nostrils daily. 11/01/23   Leath-Warren, Belen Bowers, NP  Melatonin 10 MG TABS Take 1 tablet by mouth as needed.    [provider]  metFORMIN  (GLUCOPHAGE ) 500 MG tablet Take 500 mg by mouth 2 (two) times daily with a meal.     [provider]  polyethylene glycol powder (GLYCOLAX /MIRALAX ) 17 GM/SCOOP powder Take 17 g by mouth 2 (two) times daily as needed. 10/09/21   Santana Cue, MD  triamcinolone  cream (KENALOG ) 0.1 % Apply 1 Application topically 2 (two) times daily. 02/02/23   Leath-Warren, Belen Bowers, NP    Family History History reviewed. No pertinent family history.  Social History Social History   Tobacco Use   Smoking status: Former    Current packs/day: 0.00    Types: Cigarettes    Quit date: 10/30/1990    Years since quitting: 33.1   Smokeless tobacco: Never  Vaping Use   Vaping status: Never Used  Substance Use Topics   Alcohol use: No   Drug use: No     Allergies   Peanut-containing drug products, Shellfish allergy, and Sulfonamide derivatives   Review of Systems Review of Systems Per HPI  Physical Exam Triage Vital Signs ED Triage Vitals  Encounter Vitals Group     BP 12/12/23 1009 130/75     Systolic BP Percentile --      Diastolic BP Percentile --      Pulse Rate 12/12/23 1009 88     Resp 12/12/23 1009 16     Temp 12/12/23 1009 98 F (36.7 C)     Temp Source 12/12/23 1009 Oral     SpO2 12/12/23 1009 96 %     Weight --      Height --      Head Circumference --      Peak Flow --      Pain Score 12/12/23 1011 0     Pain Loc --      Pain Education --      Exclude from Growth Chart --    No data found.  Updated Vital Signs BP 130/75 (BP Location: Right Arm)   Pulse 88   Temp 98 F (36.7 C) (Oral)   Resp 16   LMP 03/22/2016   SpO2 96%   Visual Acuity Right Eye Distance:   Left Eye Distance:   Bilateral Distance:    Right Eye Near:   Left Eye Near:    Bilateral Near:     Physical Exam Vitals and nursing note reviewed.  Constitutional:      Appearance: Normal appearance.  HENT:     Head: Normocephalic.  Eyes:     Extraocular Movements: Extraocular movements intact.     Conjunctiva/sclera: Conjunctivae normal.     Pupils: Pupils are  equal, round, and reactive to light.  Cardiovascular:     Rate and Rhythm: Normal rate and regular rhythm.     Pulses: Normal pulses.     Heart sounds: Normal heart sounds.  Pulmonary:     Effort: Pulmonary effort is normal. No respiratory distress.     Breath sounds: Normal  breath sounds. No stridor. No wheezing, rhonchi or rales.  Abdominal:     General: Bowel sounds are normal. There is no distension.     Palpations: Abdomen is soft.     Tenderness: There is no abdominal tenderness. There is no right CVA tenderness, left CVA tenderness, guarding or rebound.  Musculoskeletal:     Cervical back: Normal range of motion.  Skin:    General: Skin is warm and dry.  Neurological:     General: No focal deficit present.     Mental Status: She is alert and oriented to person, place, and time.  Psychiatric:        Mood and Affect: Mood normal.        Behavior: Behavior normal.      UC Treatments / Results  Labs (all labs ordered are listed, but only abnormal results are displayed) Labs Reviewed  POCT URINALYSIS DIP (MANUAL ENTRY) - Abnormal; Notable for the following components:      Result Value   Protein Ur, POC trace (*)    All other components within normal limits  BASIC METABOLIC PANEL WITH GFR    EKG   Radiology No results found.  Procedures Procedures (including critical care time)  Medications Ordered in UC Medications - No data to display  Initial Impression / Assessment and Plan / UC Course  I have reviewed the triage vital signs and the nursing notes.  Pertinent labs & imaging results that were available during my care of the patient were reviewed by me and considered in my medical decision making (see chart for details).  On exam, lung sounds are clear throughout, room air sats at 96%.  Abdomen is nontender, she is nondistended, and is well-appearing.  Do not suspect acute abdomen at this time.  Will treat diarrhea with Imodium A-D.  BMP pending to check  electrolyte status.  Urinalysis did not show any signs of dehydration. Supportive care recommendations were provided and discussed with the patient to include fluids, over-the-counter analgesics, and dietary changes to add bulk to her stool.  Patient was given strict ER follow-up precautions.  Patient was also given indications regarding follow-up with her PCP.  Patient was in agreement with this plan of care and verbalizes understanding.  All questions were answered.  Patient stable for discharge.  Final Clinical Impressions(s) / UC Diagnoses   Final diagnoses:  Diarrhea, unspecified type     Discharge Instructions      Your urinalysis looks good today.  Blood work is pending.  You will be contacted if the pending test results are abnormal.  You will also access to your results via MyChart. Take medication as prescribed. Continue to drink plenty of fluids.  Recommend Pedialyte or Gatorade if there is concern for dehydration. Recommend dietary choices to help relieve your diarrhea symptoms.  I have provided information that you can refer to. Follow-up in the emergency department if you experience worsening diarrhea, new symptoms of fever, chills, abdominal pain, nausea, vomiting, or other concerns. Recommend follow-up with your primary care physician if symptoms fail to improve. Follow-up as needed.    ED Prescriptions     Medication Sig Dispense Auth. Provider   loperamide (IMODIUM) 2 MG capsule Take 1 capsule (2 mg total) by mouth 4 (four) times daily as needed for diarrhea or loose stools. 12 capsule Leath-Warren, Belen Bowers, NP      PDMP not reviewed this encounter.   Hardy Lia, NP 12/12/23 1047

## 2023-12-17 ENCOUNTER — Ambulatory Visit (HOSPITAL_COMMUNITY): Payer: Self-pay

## 2023-12-17 LAB — BASIC METABOLIC PANEL WITH GFR
BUN/Creatinine Ratio: 11 — ABNORMAL LOW (ref 12–28)
BUN: 10 mg/dL (ref 8–27)
CO2: 24 mmol/L (ref 20–29)
Calcium: 9.8 mg/dL (ref 8.7–10.3)
Chloride: 113 mmol/L — ABNORMAL HIGH (ref 96–106)
Creatinine, Ser: 0.89 mg/dL (ref 0.57–1.00)
Glucose: 178 mg/dL — ABNORMAL HIGH (ref 70–99)
Potassium: 4.4 mmol/L (ref 3.5–5.2)
Sodium: 149 mmol/L — ABNORMAL HIGH (ref 134–144)
eGFR: 74 mL/min/{1.73_m2} (ref >59)

## 2023-12-28 ENCOUNTER — Encounter: Payer: Self-pay | Admitting: Family Medicine

## 2023-12-29 ENCOUNTER — Telehealth: Payer: Self-pay | Admitting: Family Medicine

## 2023-12-29 MED ORDER — DULOXETINE HCL 60 MG PO CPEP: 60.0000 mg | ORAL_CAPSULE | Freq: Every day | ORAL | 1 refills | Status: DC

## 2023-12-29 NOTE — Telephone Encounter (Signed)
 Copied from CRM [id number]. Topic: Clinical - Medication Refill >> Dec 29, 2023 12:29 PM Turkey B wrote: Medication: duloxetine  60 mg capsules  Has the patient contacted their pharmacy? yes (Agent: If yes, when and what did the pharmacy advise?)contact pcp  This is the patient's preferred pharmacy:   [facility] [place], [place] - [id number] [place] [address] Phone: [phone] Fax: [phone]   Is this the correct pharmacy for this prescription? yes  Has the prescription been filled recently? no  Is the patient out of the medication? No has 4 left  Has the patient been seen for an appointment in the last year OR does the patient have an upcoming appointment? yes  Can we respond through MyChart? yes  Agent: Please be advised that Rx refills may take up to 3 business days. We ask that you follow-up with your pharmacy.

## 2024-01-07 ENCOUNTER — Other Ambulatory Visit: Payer: Self-pay | Admitting: Family Medicine

## 2024-01-07 DIAGNOSIS — F339 Major depressive disorder, recurrent, unspecified: Secondary | ICD-10-CM

## 2024-01-07 MED ORDER — DULOXETINE HCL 60 MG PO CPEP: 60.0000 mg | ORAL_CAPSULE | Freq: Every day | ORAL | 1 refills | Status: AC

## 2024-02-18 ENCOUNTER — Ambulatory Visit: Admitting: Family Medicine

## 2024-02-23 ENCOUNTER — Encounter: Payer: Self-pay | Admitting: Family Medicine

## 2024-03-18 ENCOUNTER — Ambulatory Visit (INDEPENDENT_AMBULATORY_CARE_PROVIDER_SITE_OTHER): Admitting: Orthopedic Surgery

## 2024-03-18 ENCOUNTER — Encounter: Payer: Self-pay | Admitting: Orthopedic Surgery

## 2024-03-18 VITALS — BP 137/83 | HR 81 | Ht 65.0 in | Wt 123.0 lb

## 2024-03-18 DIAGNOSIS — M79671 Pain in right foot: Secondary | ICD-10-CM

## 2024-03-18 NOTE — Progress Notes (Signed)
 Return patient Visit  Assessment: Joy Bautista is a 58 y.o. female with the following: 1.  Right heel pain;   Plan: Joy Bautista has acute onset right heel pain.  It got worse after hikes this past weekend.  On exam, she has tenderness to palpation in the plantar heel, in line with the plantar fascia.  Otherwise there is no swelling, bruising or redness about the foot or the ankle.  Mild discomfort with dorsiflexion of the right foot.  We discussed multiple treatment options.  Given the acute nature of the pain, and limitations at this causing, she is interested in an injection.  This was completed in clinic today.  She can continue with medications as needed.  She can use ice.  Also recommended some heel cups or pads in her shoes to provide additional support and help with her symptoms.  She states understanding.  If she continues to have issues, I will see her in clinic.  We may have to consider physical therapy or an MRI.   Procedure note injection - Right plantar heel  Verbal consent was obtained to inject the Right plantar heel Timeout was completed to confirm the site of injection.  The skin was prepped with alcohol and ethyl chloride was sprayed at the injection site.  A 21-gauge needle was used to inject 40 mg of Depo-Medrol  and 1% lidocaine  (1 cc) into the Right heel using a medial approach.  There were no complications. Patient tolerated the procedure well. A sterile bandage was applied   Follow-up: Return if symptoms worsen or fail to improve.  Subjective:  Chief Complaint  Patient presents with   Foot Pain    R foot pain back and getting progressively worse. Pt states it feels swollen and pain is going into the ankle and calf for approx 1-2 wks.    History of Present Illness: Joy Bautista is a 58 y.o. female who returns to clinic for repeat evaluation of right heel pain.  I saw her in clinic about a year ago.  She states that the pain that she is experiencing has  progressively worsened since then.  However, she went on a hike last weekend, and since then has had a lot more discomfort.  She states the hike was typical for her.  There were no stumbles, falls or rolling her right ankle.  She did not step on a rock or root in the ground.  No specific injury.  She tried some Aleve , with no improvement in her symptoms.  She tried some topical treatments, without improvement.  Ice did seem to help a little bit.   Review of Systems: No fevers or chills No numbness or tingling No chest pain No shortness of breath No bowel or bladder dysfunction No GI distress No headaches    Objective: BP 137/83   Pulse 81   Ht 5' 5 (1.651 m)   Wt 123 lb (55.8 kg)   LMP 03/22/2016   BMI 20.47 kg/m   Physical Exam:  General: Alert and oriented. and No acute distress. Gait: Right sided antalgic gait.  Evaluation of the right foot demonstrates no swelling.  No redness.  No bruising.  No tenderness about the ankle.  There is tenderness to palpation of the plantar heel.  This is associated with some radiating pains distally.  She does have some tightness of the heel cord, but can get beyond a plantigrade position.  Toes are warm and well-perfused.  Sensation intact throughout the right foot.  Some  IMAGING: No new imaging obtained today   New Medications:  No orders of the defined types were placed in this encounter.     Oneil DELENA Horde, MD  03/18/2024 9:54 AM

## 2024-03-18 NOTE — Patient Instructions (Addendum)
 Plantar Fasciitis Rehab Ask your health care provider which exercises are safe for you. Do exercises exactly as told by your health care provider and adjust them as directed. It is normal to feel mild stretching, pulling, tightness, or discomfort as you do these exercises. Stop right away if you feel sudden pain or your pain gets worse. Do not begin these exercises until told by your health care provider.   Stretching and range-of-motion exercises These exercises warm up your muscles and joints and improve the movement and flexibility of your foot. These exercises also help to relieve pain.   Plantar fascia stretch  Sit with your left / right leg crossed over your opposite knee. Hold your heel with one hand with that thumb near your arch. With your other hand, hold your toes and gently pull them back toward the top of your foot. You should feel a stretch on the base (bottom) of your toes, or the bottom of your foot (plantar fascia), or both. Hold this stretch 10 seconds. Slowly release your toes and return to the starting position. Repeat 10 times. Complete this exercise 1-2 times a day.   Gastrocnemius stretch, standing This exercise is also called a calf (gastroc) stretch. It stretches the muscles in the back of the upper calf. Stand with your hands against a wall. Extend your left / right leg behind you, and bend your front knee slightly. Keeping your heels on the floor, your toes facing forward, and your back knee straight, shift your weight toward the wall. Do not arch your back. You should feel a gentle stretch in your upper calf. Hold this position for 10 seconds. Repeat 10 times. Complete this exercise 1-2 times a day.   Soleus stretch, standing This exercise is also called a calf (soleus) stretch. It stretches the muscles in the back of the lower calf. Stand with your hands against a wall. Extend your left / right leg behind you, and bend your front knee slightly. Keeping your  heels on the floor and your toes facing forward, bend your back knee and shift your weight slightly over your back leg. You should feel a gentle stretch deep in your lower calf. Hold this position for 10 seconds. Repeat 10 times. Complete this exercise 1-2 times a day.   Gastroc and soleus stretch, standing step This exercise stretches the muscles in the back of the lower leg. These muscles are in the upper calf (gastrocnemius) and the lower calf (soleus). Stand with the ball of your left / right foot on the front of a step. The ball of your foot is on the walking surface, right under your toes. Keep your other foot firmly on the same step. Hold on to the wall or a railing for balance. Slowly lift your other foot, allowing your body weight to press your heel down over the edge of the front of the step. Keep knee straight and unbent. You should feel a stretch in your calf. Hold this position for 10 seconds. Return both feet to the step. Repeat this exercise with a slight bend in your left / right knee. Repeat 10 times with your left / right knee straight and 10 times with your left / right knee bent. Complete this exercise 1-2 times a day.   Balance exercise This exercise builds your balance and strength control of your arch to help take pressure off your plantar fascia.   Single leg stand If this exercise is too easy, you can try it with  your eyes closed or while standing on a pillow. Without shoes, stand near a railing or in a doorway. You may hold on to the railing or door frame as needed. Stand on your left / right foot. Keep your big toe down on the floor and lift the arch of your foot. You should feel a stretch across the bottom of your foot and your arch. Do not let your foot roll inward. Hold this position for 10 seconds. Repeat 10 times. Complete this exercise 1-2 times a day. This information is not intended to replace advice given to you by your health care provider. Make sure  you discuss any questions you have with your health care provider.     Instructions Following Joint Injections  In clinic today, you received an injection in one of your joints (sometimes more than one).  Occasionally, you can have some pain at the injection site, this is normal.  You can place ice at the injection site, or take over-the-counter medications such as Tylenol  (acetaminophen ) or Advil (ibuprofen).  Please follow all directions listed on the bottle.  If your joint (knee or shoulder) becomes swollen, red or very painful, please contact the clinic for additional assistance.   Two medications were injected, including lidocaine  and a steroid (often referred to as cortisone).  Lidocaine  is effective almost immediately but wears off quickly.  However, the steroid can take a few days to improve your symptoms.  In some cases, it can make your pain worse for a couple of days.  Do not be concerned if this happens as it is common.  You can apply ice or take some over-the-counter medications as needed.   Injections in the same joint cannot be repeated for 3 months.  This helps to limit the risk of an infection in the joint.  If you were to develop an infection in your joint, the best treatment option would be surgery.

## 2024-03-21 ENCOUNTER — Telehealth: Payer: Self-pay | Admitting: Orthopedic Surgery

## 2024-03-21 NOTE — Telephone Encounter (Signed)
 She will continue to ice  She does want the prednisone  Will monitor her blood glucose levels [facility] [address]

## 2024-03-21 NOTE — Telephone Encounter (Signed)
 Dr. Onesimo pt - pt was seen Friday, she got an injection, she stated it has not helped at all, the pain is full force.  She wants to know what the next step is.

## 2024-05-09 ENCOUNTER — Encounter: Payer: Self-pay | Admitting: Family Medicine

## 2024-05-16 NOTE — Progress Notes (Signed)
 Whitni Pasquini                                          MRN: [id number]   05/16/2024   The [facility] Quality Team Specialist reviewed this patient medical record for the purposes of chart review for care gap closure. The following were reviewed: chart review for care gap closure-kidney health evaluation for diabetes:eGFR  and uACR.    [facility] Quality Team
